# Patient Record
Sex: Female | Born: 1970 | Race: White | Hispanic: No | Smoking: Current every day smoker
Health system: Southern US, Community
[De-identification: ages and names within clinical notes are randomized; demographics above are authoritative.]

## PROBLEM LIST (undated history)

## (undated) DIAGNOSIS — R519 Headache, unspecified: Secondary | ICD-10-CM

## (undated) DIAGNOSIS — F419 Anxiety disorder, unspecified: Secondary | ICD-10-CM

## (undated) DIAGNOSIS — R112 Nausea with vomiting, unspecified: Secondary | ICD-10-CM

## (undated) DIAGNOSIS — Z9889 Other specified postprocedural states: Secondary | ICD-10-CM

## (undated) DIAGNOSIS — R51 Headache: Secondary | ICD-10-CM

## (undated) DIAGNOSIS — Z5111 Encounter for antineoplastic chemotherapy: Secondary | ICD-10-CM

## (undated) DIAGNOSIS — Z923 Personal history of irradiation: Secondary | ICD-10-CM

## (undated) DIAGNOSIS — R918 Other nonspecific abnormal finding of lung field: Secondary | ICD-10-CM

## (undated) DIAGNOSIS — N390 Urinary tract infection, site not specified: Secondary | ICD-10-CM

## (undated) HISTORY — DX: Encounter for antineoplastic chemotherapy: Z51.11

## (undated) HISTORY — DX: Anxiety disorder, unspecified: F41.9

---

## 1998-08-30 HISTORY — PX: BREAST SURGERY: SHX581

## 2017-01-31 DIAGNOSIS — N951 Menopausal and female climacteric states: Secondary | ICD-10-CM | POA: Insufficient documentation

## 2017-04-15 ENCOUNTER — Ambulatory Visit (HOSPITAL_BASED_OUTPATIENT_CLINIC_OR_DEPARTMENT_OTHER)
Admission: RE | Admit: 2017-04-15 | Discharge: 2017-04-15 | Disposition: A | Discharge status: Home / Self Care | Payer: Self-pay | Source: Ambulatory Visit | Attending: Pulmonary Disease | Admitting: Pulmonary Disease

## 2017-04-15 ENCOUNTER — Ambulatory Visit (INDEPENDENT_AMBULATORY_CARE_PROVIDER_SITE_OTHER): Payer: Self-pay | Admitting: Pulmonary Disease

## 2017-04-15 ENCOUNTER — Encounter: Payer: Self-pay | Admitting: Pulmonary Disease

## 2017-04-15 ENCOUNTER — Other Ambulatory Visit (INDEPENDENT_AMBULATORY_CARE_PROVIDER_SITE_OTHER): Payer: Self-pay

## 2017-04-15 VITALS — BP 116/72 | HR 88 | Ht 64.0 in | Wt 168.0 lb

## 2017-04-15 DIAGNOSIS — R918 Other nonspecific abnormal finding of lung field: Secondary | ICD-10-CM

## 2017-04-15 DIAGNOSIS — R053 Chronic cough: Secondary | ICD-10-CM | POA: Insufficient documentation

## 2017-04-15 DIAGNOSIS — C771 Secondary and unspecified malignant neoplasm of intrathoracic lymph nodes: Secondary | ICD-10-CM | POA: Insufficient documentation

## 2017-04-15 DIAGNOSIS — R05 Cough: Secondary | ICD-10-CM

## 2017-04-15 DIAGNOSIS — C801 Malignant (primary) neoplasm, unspecified: Secondary | ICD-10-CM | POA: Insufficient documentation

## 2017-04-15 DIAGNOSIS — R911 Solitary pulmonary nodule: Secondary | ICD-10-CM | POA: Insufficient documentation

## 2017-04-15 LAB — CBC WITH DIFFERENTIAL/PLATELET
BASOS ABS: 0.1 10*3/uL (ref 0.0–0.1)
Basophils Relative: 0.8 % (ref 0.0–3.0)
EOS ABS: 0.1 10*3/uL (ref 0.0–0.7)
Eosinophils Relative: 0.7 % (ref 0.0–5.0)
HEMATOCRIT: 43.2 % (ref 36.0–46.0)
Hemoglobin: 14.3 g/dL (ref 12.0–15.0)
LYMPHS PCT: 20.2 % (ref 12.0–46.0)
Lymphs Abs: 2.8 10*3/uL (ref 0.7–4.0)
MCHC: 33.2 g/dL (ref 30.0–36.0)
MCV: 95.5 fl (ref 78.0–100.0)
Monocytes Absolute: 0.9 10*3/uL (ref 0.1–1.0)
Monocytes Relative: 6.4 % (ref 3.0–12.0)
NEUTROS ABS: 10 10*3/uL — AB (ref 1.4–7.7)
NEUTROS PCT: 71.9 % (ref 43.0–77.0)
PLATELETS: 559 10*3/uL — AB (ref 150.0–400.0)
RBC: 4.53 Mil/uL (ref 3.87–5.11)
RDW: 14 % (ref 11.5–15.5)
WBC: 13.9 10*3/uL — ABNORMAL HIGH (ref 4.0–10.5)

## 2017-04-15 LAB — COMPREHENSIVE METABOLIC PANEL
ALT: 19 U/L (ref 0–35)
AST: 14 U/L (ref 0–37)
Albumin: 3.4 g/dL — ABNORMAL LOW (ref 3.5–5.2)
Alkaline Phosphatase: 160 U/L — ABNORMAL HIGH (ref 39–117)
BILIRUBIN TOTAL: 1.1 mg/dL (ref 0.2–1.2)
BUN: 7 mg/dL (ref 6–23)
CHLORIDE: 96 meq/L (ref 96–112)
CO2: 29 meq/L (ref 19–32)
CREATININE: 0.57 mg/dL (ref 0.40–1.20)
Calcium: 9.2 mg/dL (ref 8.4–10.5)
GFR: 120.1 mL/min (ref >60.00)
GLUCOSE: 93 mg/dL (ref 70–99)
Potassium: 3.7 mEq/L (ref 3.5–5.1)
Sodium: 131 mEq/L — ABNORMAL LOW (ref 135–145)
Total Protein: 6.3 g/dL (ref 6.0–8.3)

## 2017-04-15 LAB — TSH: TSH: 1.27 u[IU]/mL (ref 0.35–4.50)

## 2017-04-15 MED ORDER — IOPAMIDOL (ISOVUE-300) INJECTION 61%
75.0000 mL | Freq: Once | INTRAVENOUS | Status: AC | PRN
  Administered 2017-04-15: 75 mL via INTRAVENOUS

## 2017-04-15 NOTE — Patient Instructions (Signed)
Take Delsym cough syrup 5 ML twice daily for cough for 4 weeks Blood work today Schedule CT chest with IV contrast  We will call you with results

## 2017-04-15 NOTE — Assessment & Plan Note (Addendum)
  Blood work today Schedule CT chest with IV contrast  Addendum-CT chest showed 2.2 cm left upper lobe nodule with enlarged infrahilar and subcarinal lymphadenopathy very suspicious for metastatic malignancy. We'll proceed with PET scan and based on this she will need bronchoscopy with endobronchial ultrasound to sample the lymph nodes. Labs showed hyponatremia which would be consistent with lung malignancy and mild leukocytosis which may be related to steroids

## 2017-04-15 NOTE — Addendum Note (Signed)
Addended by: Kara Mead V on: 04/15/2017 05:10 PM   Modules accepted: Level of Service

## 2017-04-15 NOTE — Assessment & Plan Note (Signed)
Presumed post bronchitic, unless due to lung mass  Take Delsym cough syrup 5 ML twice daily for cough for 4 weeks

## 2017-04-15 NOTE — Progress Notes (Signed)
   Subjective:    Patient ID: Raven Cochran, female    DOB: [birth year], 46 y.o.   MRN: [id number]  HPI  46 y.o. smoker presents for evaluation of abnormal chest x-ray. [facility] complaining of postnasal drainage and cough for 6 weeks . She associates this with taking antibiotic for a kidney infection given by her OB. she took Mucinex for 2 days and apparently this caused swelling in her hands and feet. Chest x-ray was performed which showed increased density in the left mid lung field and prominent left hilar lymphadenopathy  For her, she was given antibiotics x2, prednisone taper and Tessalon Perles but this is not really helped her cough and cough is persistent. She describes a deep cough occasionally productive of yellow sputum probably sometimes wakes her up at night.  She was given Lasix by her OB but this has not affected her swelling  She denies fevers, hemoptysis, sick contacts or weight loss or loss of appetite       No past medical history on file.  No past surgical history on file. -Breast augmentation 2010  No Known Allergies   Social History   Social History  . Marital status: Divorced    Spouse name: N/A  . Number of children: N/A  . Years of education: N/A   Occupational History  . Not on file.   Social History Main Topics  . Smoking status: Current Every Day Smoker    Types: Cigarettes  . Smokeless tobacco: Never Used  . Alcohol use No  . Drug use: No  . Sexual activity: Not on file   Other Topics Concern  . Not on file   Social History Narrative  . No narrative on file     Review of Systems Positive for shortness of breath with activity, productive cough, chest pain, loss of appetite, sore throat, nasal congestion and sneezing and swelling of her hands and feet  Constitutional: negative for anorexia, fevers and sweats  Eyes: negative for irritation, redness and visual disturbance  Ears, nose, mouth, throat, and  face: negative for earaches, epistaxis, nasal congestion and sore throat  Respiratory: negative for sputum and wheezing  Cardiovascular: negative for orthopnea, palpitations and syncope  Gastrointestinal: negative for abdominal pain, constipation, diarrhea, melena, nausea and vomiting  Genitourinary:negative for dysuria, frequency and hematuria  Hematologic/lymphatic: negative for bleeding, easy bruising and lymphadenopathy  Musculoskeletal:negative for arthralgias, muscle weakness and stiff joints  Neurological: negative for coordination problems, gait problems, headaches and weakness  Endocrine: negative for diabetic symptoms including polydipsia, polyuria and weight loss     Objective:   Physical Exam  Gen. Pleasant, well-nourished, in no distress, normal affect ENT - no lesions, no post nasal drip Neck: No JVD, no thyromegaly, no carotid bruits Lungs: no use of accessory muscles, no dullness to percussion, clear without rales or rhonchi  Cardiovascular: Rhythm regular, heart sounds  normal, no murmurs or gallops, no peripheral edema Abdomen: soft and non-tender, no hepatosplenomegaly, BS normal. Musculoskeletal: No deformities, no cyanosis or clubbing, stubby fingers & toes Neuro:  alert, non focal       Assessment & Plan:

## 2017-04-18 ENCOUNTER — Telehealth: Payer: Self-pay | Admitting: Pulmonary Disease

## 2017-04-18 NOTE — Telephone Encounter (Signed)
Spoke with someone at [facility]. They wanted to make [place] aware of pt's CT chest report. It looks like RA has already resulted this. Nothing further was needed.

## 2017-04-19 ENCOUNTER — Telehealth: Payer: Self-pay | Admitting: Pulmonary Disease

## 2017-04-19 DIAGNOSIS — R911 Solitary pulmonary nodule: Secondary | ICD-10-CM

## 2017-04-19 DIAGNOSIS — R590 Localized enlarged lymph nodes: Secondary | ICD-10-CM

## 2017-04-19 NOTE — Telephone Encounter (Signed)
Notes recorded by Rigoberto Noel, MD on 04/15/2017 at 5:05 PM EDT CT chest showed left upper lobe nodule and enlarged lymph nodes in the chest She will need a biopsy of the enlarged lymph glands but prior to that I would like her to get a PET scan which is a whole-body scan Labs showed mild increase in white cell count and slightly low sodium  Spoke with patient regarding her lab results. She verbalized understanding. Nothing else needed at time of call. Will place order for PET scan today.

## 2017-04-19 NOTE — Telephone Encounter (Signed)
Spoke with pt. She is requesting her lab results from 04/15/17. Pt is aware that RA is on vacation is she is not willing to wait until his return.  TP - please advise. Thanks.

## 2017-04-26 ENCOUNTER — Encounter (HOSPITAL_COMMUNITY): Payer: Self-pay | Admitting: Radiology

## 2017-04-26 ENCOUNTER — Ambulatory Visit (HOSPITAL_COMMUNITY)
Admission: RE | Admit: 2017-04-26 | Discharge: 2017-04-26 | Disposition: A | Discharge status: Home / Self Care | Payer: Self-pay | Source: Ambulatory Visit | Attending: Pulmonary Disease | Admitting: Pulmonary Disease

## 2017-04-26 DIAGNOSIS — R911 Solitary pulmonary nodule: Secondary | ICD-10-CM | POA: Insufficient documentation

## 2017-04-26 DIAGNOSIS — C779 Secondary and unspecified malignant neoplasm of lymph node, unspecified: Secondary | ICD-10-CM | POA: Insufficient documentation

## 2017-04-26 DIAGNOSIS — R590 Localized enlarged lymph nodes: Secondary | ICD-10-CM | POA: Insufficient documentation

## 2017-04-26 LAB — GLUCOSE, CAPILLARY: GLUCOSE-CAPILLARY: 104 mg/dL — AB (ref 65–99)

## 2017-04-26 MED ORDER — FLUDEOXYGLUCOSE F - 18 (FDG) INJECTION
8.6000 | Freq: Once | INTRAVENOUS | Status: AC | PRN
  Administered 2017-04-26: 8.6 via INTRAVENOUS

## 2017-04-27 ENCOUNTER — Telehealth: Payer: Self-pay | Admitting: Pulmonary Disease

## 2017-04-27 DIAGNOSIS — R590 Localized enlarged lymph nodes: Secondary | ICD-10-CM

## 2017-04-27 NOTE — Telephone Encounter (Signed)
Hey do you have any day preferences and [place] or [place]

## 2017-04-27 NOTE — Telephone Encounter (Signed)
Discussed PET scan results. We'll proceed with EBUS and bronchoscopy -discussed procedure   And risks and benefits

## 2017-04-29 ENCOUNTER — Encounter (HOSPITAL_COMMUNITY): Payer: Self-pay | Admitting: *Deleted

## 2017-04-29 ENCOUNTER — Other Ambulatory Visit: Payer: Self-pay | Admitting: Pulmonary Disease

## 2017-05-03 ENCOUNTER — Ambulatory Visit (HOSPITAL_COMMUNITY): Payer: Self-pay

## 2017-05-03 ENCOUNTER — Ambulatory Visit (HOSPITAL_COMMUNITY)
Admission: RE | Admit: 2017-05-03 | Discharge: 2017-05-03 | Disposition: A | Discharge status: Home / Self Care | Payer: Self-pay | Source: Ambulatory Visit | Attending: Pulmonary Disease | Admitting: Pulmonary Disease

## 2017-05-03 ENCOUNTER — Ambulatory Visit (HOSPITAL_COMMUNITY): Payer: Self-pay | Admitting: Anesthesiology

## 2017-05-03 ENCOUNTER — Encounter (HOSPITAL_COMMUNITY)
Admission: RE | Disposition: A | Discharge status: Home / Self Care | Payer: Self-pay | Source: Ambulatory Visit | Attending: Pulmonary Disease

## 2017-05-03 ENCOUNTER — Encounter (HOSPITAL_COMMUNITY): Payer: Self-pay | Admitting: *Deleted

## 2017-05-03 ENCOUNTER — Other Ambulatory Visit (HOSPITAL_COMMUNITY)
Admission: RE | Admit: 2017-05-03 | Discharge: 2017-05-03 | Disposition: A | Discharge status: Home / Self Care | Payer: Self-pay | Source: Ambulatory Visit | Attending: Internal Medicine | Admitting: Internal Medicine

## 2017-05-03 DIAGNOSIS — R918 Other nonspecific abnormal finding of lung field: Secondary | ICD-10-CM | POA: Insufficient documentation

## 2017-05-03 DIAGNOSIS — Z9889 Other specified postprocedural states: Secondary | ICD-10-CM

## 2017-05-03 DIAGNOSIS — C3490 Malignant neoplasm of unspecified part of unspecified bronchus or lung: Secondary | ICD-10-CM | POA: Insufficient documentation

## 2017-05-03 DIAGNOSIS — C3432 Malignant neoplasm of lower lobe, left bronchus or lung: Secondary | ICD-10-CM | POA: Insufficient documentation

## 2017-05-03 DIAGNOSIS — R590 Localized enlarged lymph nodes: Secondary | ICD-10-CM

## 2017-05-03 DIAGNOSIS — F17210 Nicotine dependence, cigarettes, uncomplicated: Secondary | ICD-10-CM | POA: Insufficient documentation

## 2017-05-03 HISTORY — DX: Other specified postprocedural states: Z98.890

## 2017-05-03 HISTORY — DX: Nausea with vomiting, unspecified: R11.2

## 2017-05-03 HISTORY — DX: Headache: R51

## 2017-05-03 HISTORY — DX: Urinary tract infection, site not specified: N39.0

## 2017-05-03 HISTORY — PX: ENDOBRONCHIAL ULTRASOUND: SHX5096

## 2017-05-03 HISTORY — DX: Headache, unspecified: R51.9

## 2017-05-03 HISTORY — DX: Other nonspecific abnormal finding of lung field: R91.8

## 2017-05-03 SURGERY — ENDOBRONCHIAL ULTRASOUND (EBUS)
Anesthesia: General | Laterality: Bilateral

## 2017-05-03 MED ORDER — ONDANSETRON HCL 4 MG/2ML IJ SOLN
INTRAMUSCULAR | Status: DC | PRN
  Administered 2017-05-03: 4 mg via INTRAVENOUS

## 2017-05-03 MED ORDER — PROPOFOL 10 MG/ML IV BOLUS
INTRAVENOUS | Status: AC
  Filled 2017-05-03: qty 20

## 2017-05-03 MED ORDER — LACTATED RINGERS IV SOLN: INTRAVENOUS | Status: DC

## 2017-05-03 MED ORDER — SUGAMMADEX SODIUM 200 MG/2ML IV SOLN
INTRAVENOUS | Status: DC | PRN
  Administered 2017-05-03: 150 mg via INTRAVENOUS

## 2017-05-03 MED ORDER — SUCCINYLCHOLINE CHLORIDE 20 MG/ML IJ SOLN
INTRAMUSCULAR | Status: DC | PRN
  Administered 2017-05-03: 100 mg via INTRAVENOUS

## 2017-05-03 MED ORDER — PHENYLEPHRINE 40 MCG/ML (10ML) SYRINGE FOR IV PUSH (FOR BLOOD PRESSURE SUPPORT)
PREFILLED_SYRINGE | INTRAVENOUS | Status: AC
  Filled 2017-05-03: qty 20

## 2017-05-03 MED ORDER — FENTANYL CITRATE (PF) 100 MCG/2ML IJ SOLN
INTRAMUSCULAR | Status: AC
  Filled 2017-05-03: qty 2

## 2017-05-03 MED ORDER — MIDAZOLAM HCL 2 MG/2ML IJ SOLN
INTRAMUSCULAR | Status: AC
  Filled 2017-05-03: qty 2

## 2017-05-03 MED ORDER — ROCURONIUM BROMIDE 50 MG/5ML IV SOSY
PREFILLED_SYRINGE | INTRAVENOUS | Status: AC
  Filled 2017-05-03: qty 5

## 2017-05-03 MED ORDER — FENTANYL CITRATE (PF) 100 MCG/2ML IJ SOLN
INTRAMUSCULAR | Status: DC | PRN
  Administered 2017-05-03: 100 ug via INTRAVENOUS

## 2017-05-03 MED ORDER — LACTATED RINGERS IV SOLN
INTRAVENOUS | Status: DC
  Administered 2017-05-03: 1000 mL via INTRAVENOUS

## 2017-05-03 MED ORDER — ONDANSETRON HCL 4 MG/2ML IJ SOLN
INTRAMUSCULAR | Status: AC
  Filled 2017-05-03: qty 2

## 2017-05-03 MED ORDER — LIDOCAINE 2% (20 MG/ML) 5 ML SYRINGE
INTRAMUSCULAR | Status: AC
  Filled 2017-05-03: qty 10

## 2017-05-03 MED ORDER — SUGAMMADEX SODIUM 200 MG/2ML IV SOLN
INTRAVENOUS | Status: AC
  Filled 2017-05-03: qty 2

## 2017-05-03 MED ORDER — LIDOCAINE HCL (CARDIAC) 20 MG/ML IV SOLN
INTRAVENOUS | Status: DC | PRN
  Administered 2017-05-03 (×2): 50 mg via INTRAVENOUS

## 2017-05-03 MED ORDER — DEXAMETHASONE SODIUM PHOSPHATE 10 MG/ML IJ SOLN
INTRAMUSCULAR | Status: AC
  Filled 2017-05-03: qty 1

## 2017-05-03 MED ORDER — MIDAZOLAM HCL 5 MG/5ML IJ SOLN
INTRAMUSCULAR | Status: DC | PRN
  Administered 2017-05-03: 1 mg via INTRAVENOUS
  Administered 2017-05-03: 2 mg via INTRAVENOUS

## 2017-05-03 MED ORDER — DEXAMETHASONE SODIUM PHOSPHATE 10 MG/ML IJ SOLN
INTRAMUSCULAR | Status: DC | PRN
  Administered 2017-05-03: 10 mg via INTRAVENOUS

## 2017-05-03 MED ORDER — PROPOFOL 10 MG/ML IV BOLUS
INTRAVENOUS | Status: DC | PRN
Start: 2017-05-03 — End: 2017-05-03
  Administered 2017-05-03: 150 mg via INTRAVENOUS
  Administered 2017-05-03: 50 mg via INTRAVENOUS

## 2017-05-03 MED ORDER — SUCCINYLCHOLINE CHLORIDE 200 MG/10ML IV SOSY
PREFILLED_SYRINGE | INTRAVENOUS | Status: AC
  Filled 2017-05-03: qty 10

## 2017-05-03 MED ORDER — PROMETHAZINE HCL 25 MG/ML IJ SOLN: 6.2500 mg | INTRAMUSCULAR | Status: DC | PRN

## 2017-05-03 MED ORDER — FENTANYL CITRATE (PF) 100 MCG/2ML IJ SOLN: 25.0000 ug | INTRAMUSCULAR | Status: DC | PRN

## 2017-05-03 MED ORDER — ROCURONIUM BROMIDE 100 MG/10ML IV SOLN
INTRAVENOUS | Status: DC | PRN
  Administered 2017-05-03: 20 mg via INTRAVENOUS

## 2017-05-03 NOTE — Interval H&P Note (Signed)
History and Physical Interval Note:  05/03/2017 11:55 AM  Hallie Adah Salvage  has presented today for surgery, with the diagnosis of MEDIAL STINAL LYMPHADENOTATHY  The various methods of treatment have been discussed with the patient and family. After consideration of risks, benefits and other options for treatment, the patient has consented to  Procedure(s): ENDOBRONCHIAL ULTRASOUND (Bilateral) as a surgical intervention .  The patient's history has been reviewed, patient examined, no change in status, stable for surgery.  I have reviewed the patient's chart and labs.  Questions were answered to the patient's satisfaction.     ALVA,RAKESH V.

## 2017-05-03 NOTE — Progress Notes (Signed)
Video bronchoscopy performed following EBUS procedure. Intervention bronchial washings Intervention bronchial brushings Intervention bronchial biopsies Pt tolerated well  Kathie Dike RRT

## 2017-05-03 NOTE — Anesthesia Procedure Notes (Signed)
Procedure Name: Intubation Date/Time: 05/03/2017 12:17 PM Performed by: Glory Buff Pre-anesthesia Checklist: Patient identified, Emergency Drugs available, Suction available and Patient being monitored Patient Re-evaluated:Patient Re-evaluated prior to induction Oxygen Delivery Method: Circle system utilized Preoxygenation: Pre-oxygenation with 100% oxygen Induction Type: IV induction Ventilation: Mask ventilation without difficulty Laryngoscope Size: Miller and 3 Grade View: Grade III Tube type: Oral Tube size: 8.5 mm Number of attempts: 1 Airway Equipment and Method: Stylet and Oral airway Placement Confirmation: ETT inserted through vocal cords under direct vision,  positive ETCO2 and breath sounds checked- equal and bilateral Secured at: 21 cm Tube secured with: Tape Dental Injury: Teeth and Oropharynx as per pre-operative assessment

## 2017-05-03 NOTE — Anesthesia Preprocedure Evaluation (Addendum)
Anesthesia Evaluation  Patient identified by MRN, date of birth, ID band Patient awake    Reviewed: Allergy & Precautions, NPO status , Patient's Chart, lab work & pertinent test results  Airway Mallampati: III  TM Distance: <3 FB Neck ROM: Full    Dental no notable dental hx. (+) Loose, Dental Advisory Given Lower back:   Pulmonary Current Smoker,    Pulmonary exam normal breath sounds clear to auscultation       Cardiovascular negative cardio ROS Normal cardiovascular exam Rhythm:Regular Rate:Normal     Neuro/Psych negative neurological ROS  negative psych ROS   GI/Hepatic negative GI ROS, Neg liver ROS,   Endo/Other  negative endocrine ROS  Renal/GU negative Renal ROS  negative genitourinary   Musculoskeletal negative musculoskeletal ROS (+)   Abdominal   Peds negative pediatric ROS (+)  Hematology negative hematology ROS (+)   Anesthesia Other Findings   Reproductive/Obstetrics negative OB ROS                           Anesthesia Physical Anesthesia Plan  ASA: II  Anesthesia Plan: General   Post-op Pain Management:    Induction: Intravenous  PONV Risk Score and Plan: 1 and Ondansetron, Dexamethasone and Treatment may vary due to age or medical condition  Airway Management Planned: Oral ETT  Additional Equipment:   Intra-op Plan:   Post-operative Plan: Extubation in OR  Informed Consent: I have reviewed the patients History and Physical, chart, labs and discussed the procedure including the risks, benefits and alternatives for the proposed anesthesia with the patient or authorized representative who has indicated his/her understanding and acceptance.   Dental advisory given  Plan Discussed with: CRNA and Surgeon  Anesthesia Plan Comments:         Anesthesia Quick Evaluation

## 2017-05-03 NOTE — H&P (View-Only) (Signed)
   Subjective:    Patient ID: Raven Cochran, female    DOB: 1971/04/28, 46 y.o.   MRN: [id number]  HPI  46 year old smoker presents for evaluation of abnormal chest x-ray. [facility] complaining of postnasal drainage and cough for 6 weeks . She associates this with taking antibiotic for a kidney infection given by her OB. she took Mucinex for 2 days and apparently this caused swelling in her hands and feet. Chest x-ray was performed which showed increased density in the left mid lung field and prominent left hilar lymphadenopathy  For her, she was given antibiotics x2, prednisone taper and Tessalon Perles but this is not really helped her cough and cough is persistent. She describes a deep cough occasionally productive of yellow sputum probably sometimes wakes her up at night.  She was given Lasix by her OB but this has not affected her swelling  She denies fevers, hemoptysis, sick contacts or weight loss or loss of appetite       No past medical history on file.  No past surgical history on file. -Breast augmentation 2010  No Known Allergies   Social History   Social History  . Marital status: Divorced    Spouse name: N/A  . Number of children: N/A  . Years of education: N/A   Occupational History  . Not on file.   Social History Main Topics  . Smoking status: Current Every Day Smoker    Types: Cigarettes  . Smokeless tobacco: Never Used  . Alcohol use No  . Drug use: No  . Sexual activity: Not on file   Other Topics Concern  . Not on file   Social History Narrative  . No narrative on file     Review of Systems Positive for shortness of breath with activity, productive cough, chest pain, loss of appetite, sore throat, nasal congestion and sneezing and swelling of her hands and feet  Constitutional: negative for anorexia, fevers and sweats  Eyes: negative for irritation, redness and visual disturbance  Ears, nose, mouth, throat, and  face: negative for earaches, epistaxis, nasal congestion and sore throat  Respiratory: negative for sputum and wheezing  Cardiovascular: negative for orthopnea, palpitations and syncope  Gastrointestinal: negative for abdominal pain, constipation, diarrhea, melena, nausea and vomiting  Genitourinary:negative for dysuria, frequency and hematuria  Hematologic/lymphatic: negative for bleeding, easy bruising and lymphadenopathy  Musculoskeletal:negative for arthralgias, muscle weakness and stiff joints  Neurological: negative for coordination problems, gait problems, headaches and weakness  Endocrine: negative for diabetic symptoms including polydipsia, polyuria and weight loss     Objective:   Physical Exam  Gen. Pleasant, well-nourished, in no distress, normal affect ENT - no lesions, no post nasal drip Neck: No JVD, no thyromegaly, no carotid bruits Lungs: no use of accessory muscles, no dullness to percussion, clear without rales or rhonchi  Cardiovascular: Rhythm regular, heart sounds  normal, no murmurs or gallops, no peripheral edema Abdomen: soft and non-tender, no hepatosplenomegaly, BS normal. Musculoskeletal: No deformities, no cyanosis or clubbing, stubby fingers & toes Neuro:  alert, non focal       Assessment & Plan:

## 2017-05-03 NOTE — Transfer of Care (Signed)
Immediate Anesthesia Transfer of Care Note  Patient: Raven Cochran  Procedure(s) Performed: Procedure(s): ENDOBRONCHIAL ULTRASOUND (Bilateral)  Patient Location: PACU and Endoscopy Unit  Anesthesia Type:General  Level of Consciousness: awake and alert   Airway & Oxygen Therapy: Patient Spontanous Breathing and Patient connected to face mask oxygen  Post-op Assessment: Report given to RN and Post -op Vital signs reviewed and stable  Post vital signs: Reviewed and stable  Last Vitals:  Vitals:   05/03/17 1140  BP: 133/76  Pulse: 85  Resp: 14  Temp: 36.6 C  SpO2: 98%    Last Pain:  Vitals:   05/03/17 1140  TempSrc: Oral         Complications: No apparent anesthesia complications

## 2017-05-03 NOTE — Discharge Instructions (Signed)
Flexible Bronchoscopy, Care After These instructions give you information on caring for yourself after your procedure. Your doctor may also give you more specific instructions. Call your doctor if you have any problems or questions after your procedure. Follow these instructions at home:  Do not eat or drink anything for 2 hours after your procedure. If you try to eat or drink before the medicine wears off, food or drink could go into your lungs. You could also burn yourself.  After 2 hours have passed and when you can cough and gag normally, you may eat soft food and drink liquids slowly.  The day after the test, you may eat your normal diet.  You may do your normal activities.  Keep all doctor visits. Get help right away if:  You get more and more short of breath.  You get light-headed.  You feel like you are going to pass out (faint).  You have chest pain.  You have new problems that worry you.  You cough up more than a little blood.  You cough up more blood than before. This information is not intended to replace advice given to you by your health care provider. Make sure you discuss any questions you have with your health care provider. Document Released: 06/13/2009 Document Revised: 01/22/2016 Document Reviewed: 04/20/2013 Elsevier Interactive Patient Education  2017 [employer].

## 2017-05-03 NOTE — Op Note (Signed)
  Name:  [name] MRN:  [id number] DOB:  [birth year]  PROCEDURE NOTE  Procedure(s): Flexible bronchoscopy [phone]) Brushing [phone]) of the LUL & LLL bifurcation Bronchial alveolar lavage (93818) of the LUL Endobronchial biopsy (29937) of the LUL Endobronchial ultrasound (16967) Transbronchial needle aspiration (89381) of station 7 Transbronchial needle aspiration, additional lobe (01751) of station 10L  Indications:  LUL mass with Hilar / mediastinal lymphadenopathy.  Consent:  Written informed consent was obtained prior to the procedure. The risks of the procedure including coughing, bleeding and the small chance of lung puncture requiring chest tube were discussed in great detail. The benefits & alternatives including serial follow up were also discussed.  Anesthesia:  General endotracheal.  Procedure summary:  Appropriate equipment was assembled.  The patient was  identified as Raven Cochran. Interim history obtained and brought to the operating room. Safety timeout was performed. The patient was placed supine on the operating table, airway established and general anesthesia administered by Anesthesia team.   After the appropriate level of anesthesia was assured, flexible video bronchoscope was lubricated and inserted through the endotracheal tube.    Airway examination was performed bilaterally to subsegmental level.  Minimal clear secretions were noted, mucosa appeared cobble stoning in LUL bifurcation and narrowing of LLL bifurcation , could not be visualised distally with friable mucosa bleeding to touch, although no endobronchial lesions were identified.  Endobronchial ultrasound video bronchoscope was then lubricated and inserted through the endotracheal tube. Surveillance of the mediastinal and and bilateral hilar lymph node stations was performed.  Pathologically enlarged lymph nodes were noted at station 7 & 10L.  Endobronchial ultrasound guided transbronchial  needle aspiration of station 7  (passes 5), station 10L  (passes 3) were performed, after which EBUS bronchoscope was withdrawn.  Flexible video bronchoscope was used again to perform random endobronchial mucosal biopsies, brushings & BAL from LUL.  After ensuring hemostasis , the bronchoscope was withdrawn.  The patient was extubated in operating room and transferred to PACU. Post-procedure chest x-ray was ordered.  Specimens sent: Bronchial alveolar lavage specimen of the LUL for  microbiology and cytology. Brushing Endobronchial biopsy TBNA for cytology from station 7 & 10 L  Complications:  No immediate complications were noted.  Hemodynamic parameters and oxygenation remained stable throughout the procedure.  Estimated blood loss:  Less then 5 mL.   Kara Mead MD. Shade Flood. [facility] Pager [phone] If no response call [phone]   05/03/2017 1:11 PM

## 2017-05-03 NOTE — Anesthesia Postprocedure Evaluation (Signed)
Anesthesia Post Note  Patient: Raven Cochran  Procedure(s) Performed: Procedure(s) (LRB): ENDOBRONCHIAL ULTRASOUND (Bilateral)     Patient location during evaluation: PACU Anesthesia Type: General Level of consciousness: awake and alert Pain management: pain level controlled Vital Signs Assessment: post-procedure vital signs reviewed and stable Respiratory status: spontaneous breathing, nonlabored ventilation, respiratory function stable and patient connected to nasal cannula oxygen Cardiovascular status: blood pressure returned to baseline and stable Postop Assessment: no signs of nausea or vomiting Anesthetic complications: no    Last Vitals:  Vitals:   05/03/17 1140 05/03/17 1328  BP: 133/76 138/76  Pulse: 85 99  Resp: 14 (!) 22  Temp: 36.6 C 36.8 C  SpO2: 98% 97%    Last Pain:  Vitals:   05/03/17 1328  TempSrc: Oral                 Delphine Sizemore S

## 2017-05-03 NOTE — Anesthesia Procedure Notes (Signed)
Date/Time: 05/03/2017 1:17 PM Performed by: Cynda Familia Oxygen Delivery Method: Simple face mask Placement Confirmation: breath sounds checked- equal and bilateral and positive ETCO2 Dental Injury: Teeth and Oropharynx as per pre-operative assessment  Comments: Extubated to face mask--- good aw --- coughing--- to pacu endo o2 intact

## 2017-05-05 ENCOUNTER — Ambulatory Visit (INDEPENDENT_AMBULATORY_CARE_PROVIDER_SITE_OTHER): Payer: Self-pay | Admitting: Adult Health

## 2017-05-05 ENCOUNTER — Encounter: Payer: Self-pay | Admitting: Adult Health

## 2017-05-05 VITALS — HR 89 | Ht 64.0 in | Wt 168.0 lb

## 2017-05-05 DIAGNOSIS — R590 Localized enlarged lymph nodes: Secondary | ICD-10-CM

## 2017-05-05 DIAGNOSIS — R918 Other nonspecific abnormal finding of lung field: Secondary | ICD-10-CM

## 2017-05-05 NOTE — Assessment & Plan Note (Signed)
Lung cancer with positive malignant cells  Final path pending  Refer to Oncology .

## 2017-05-05 NOTE — Patient Instructions (Signed)
We are referring you to Oncology for lung cancer .  Their office will be in touch with the date and time .

## 2017-05-05 NOTE — Assessment & Plan Note (Signed)
LUL lung mass with adenopathy , positive on PET scan . FOB/EBUS with Positive malignant cells for lung cancer . Final path is pending with markers.  Reviewed with pt and husband . Will refer ASAP to Oncology .

## 2017-05-05 NOTE — Progress Notes (Signed)
@Patient  ID: Raven Cochran, female    DOB: 04-19-1971, 46 y.o.   MRN: [id number]  Chief Complaint  Patient presents with  . Follow-up    Referring provider: Sheldon Silvan., MD  HPI: 46 yo female smoker seen for pulmonary consult on 04/15/17 for lung mass.   TEST  CT chest 04/15/17 >LEFT upper lobe nodule concerning for bronchogenic carcinoma.Metastatic adenopathy to the LEFT hilum, subcarinal nodal station and prevascular nodal stations. 04/26/17 PET scan Hypermetabolic LEFT upper lobe pulmonary nodule consistent primary bronchogenic carcinoma. 2. Bulky hypermetabolic LEFT hilar prevascular and subcarinal / peribronchial metastatic adenopathy. 3. No contralateral or  supraclavicular metastatic lymph nodes.  05/05/2017 Follow up : Lung Mass -Lung Cancer  Pt returns for 2 week follow up . She was seen last ov for pulmonary consult for lung mass. She had had a 6 week hx of cough and was seen at urgent care , treated for bronchitis . Chest xray showed left mid lung density and left hilar adenopathy . Subsequent CT chest showed 2.2cm LUL nodule with enlarged infrahilar and subcarinal lymphadenopathy . PET scan showed Hypermetabolic left upper lobe nodule measuring 2.1 x 1.9 cm. Hypermetabolic Left hilar mass measuring 4.2 cm, hypermetabolic subcarinal node. She underwent FOB/EBUS on 05/03/2017 showed a narrowing of the left lower lobe bifurcation, although no endobronchial lesions were identified. Pathologically enlarged lymph nodes were noted at station 7 and 10. Final cytology and pathology are pending with markers. Preliminary findings are positive for malignancy.  Case was discussed with Dr. Elsworth Soho. I went over her scans and test results with patient and her husband. We discussed the need to refer to oncology. Support was provided. Since bronchoscopy. Patient says she continues to have a cough is been chronic for the last several weeks. She denies any hemoptysis, chest pain,  orthopnea, PND, or increased leg swelling.    No Known Allergies   There is no immunization history on file for this patient.  Past Medical History:  Diagnosis Date  . Headache    hx migraines none in 10 yrs  . Lung mass    left hilary mass  . PONV (postoperative nausea and vomiting)   . UTI (urinary tract infection) 9 weeks ago    Tobacco History: History  Smoking Status  . Current Every Day Smoker  . Packs/day: 1.00  . Years: 22.00  . Types: Cigarettes  Smokeless Tobacco  . Never Used   Ready to quit: Not Answered Counseling given: Not Answered   Outpatient Encounter Prescriptions as of 05/05/2017  Medication Sig  . levonorgestrel (MIRENA) 20 MCG/24HR IUD 1 each by Intrauterine route once. Inserted 8 yrs ago  . naproxen sodium (ANAPROX) 220 MG tablet Take 440 mg by mouth daily as needed (pain).   No facility-administered encounter medications on file as of 05/05/2017.      Review of Systems  Constitutional:   No  weight loss, night sweats,  Fevers, chills, +atigue, or  lassitude.  HEENT:   No headaches,  Difficulty swallowing,  Tooth/dental problems, or  Sore throat,                No sneezing, itching, ear ache, nasal congestion, post nasal drip,   CV:  No chest pain,  Orthopnea, PND, swelling in lower extremities, anasarca, dizziness, palpitations, syncope.   GI  No heartburn, indigestion, abdominal pain, nausea, vomiting, diarrhea, change in bowel habits, loss of appetite, bloody stools.   Resp: + cough -dry .  No chest wall deformity  Skin: no rash or lesions.  GU: no dysuria, change in color of urine, no urgency or frequency.  No flank pain, no hematuria   MS:  No joint pain or swelling.  No decreased range of motion.  No back pain.    Physical Exam  Pulse 89   Ht 5\' 4"  (1.626 m)   Wt 168 lb (76.2 kg)   LMP  (LMP Unknown) Comment: iud  SpO2 99%   BMI 28.84 kg/m   GEN: A/Ox3; pleasant , NAD, well nourished    HEENT:  [place]/AT,  EACs-clear,  TMs-wnl, NOSE-clear, THROAT-clear, no lesions, no postnasal drip or exudate noted.   NECK:  Supple w/ fair ROM; no JVD; normal carotid impulses w/o bruits; no thyromegaly or nodules palpated; no lymphadenopathy.    RESP  Clear  P & A; w/o, wheezes/ rales/ or rhonchi. no accessory muscle use, no dullness to percussion  CARD:  RRR, no m/r/g, no peripheral edema, pulses intact, no cyanosis or clubbing.  GI:   Soft & nt; nml bowel sounds; no organomegaly or masses detected.   Musco: Warm bil, no deformities or joint swelling noted.   Neuro: alert, no focal deficits noted.    Skin: Warm, no lesions or rashes    Lab Results:  CBC  BNP No results found for: BNP  ProBNP No results found for: PROBNP  Imaging: Ct Chest W Contrast  Result Date: 04/15/2017 CLINICAL DATA:  Persistent cough EXAM: CT CHEST WITH CONTRAST TECHNIQUE: Multidetector CT imaging of the chest was performed during intravenous contrast administration. CONTRAST:  44mL ISOVUE-300 IOPAMIDOL (ISOVUE-300) INJECTION 61% COMPARISON:  None. FINDINGS: Cardiovascular: LEFT main pulmonary artery is compressed by LEFT hilar mass. No evidence of pulmonary embolism. Coronary calcifications noted. No pericardial Mediastinum/Nodes: No axillary supraclavicular adenopathy. Bulky LEFT hilar and Prevascular lymphadenopathy. Subcarinal adenopathy additionally. Example LEFT infrahilar nodal mass measures 4 cm. Prevascular node measures 2.3 cm. Subcarinal node measures 2 cm. Lungs/Pleura: Within the LEFT upper lobe oblong nodule measures 2.2 x 1.7 cm (image 67, series 3). Upper Abdomen: Adrenal glands are partially imaged. No hepatic lesion on partially imaged liver. Musculoskeletal: No aggressive osseous lesion. Bilateral subglandular breast implants. IMPRESSION: 1. 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 2. Metastatic adenopathy to the LEFT hilum, subcarinal nodal station and prevascular nodal stations. 3. No clear  contralateral nodal metastasis or supraclavicular nodal metastasis. 4. If multidisciplinary follow up management is desired, this is available in the [place] through the Multidisciplinary Thoracic Clinic [phone]. Consider FDG PET scan for staging and biopsy planning. These results will be called to the ordering clinician or representative by the Radiologist Assistant, and communication documented in the PACS or zVision Dashboard. Electronically Signed   By: Suzy Bouchard M.D.   On: 04/15/2017 16:56   Nm Pet Image Initial (pi) Skull Base To Thigh  Result Date: 04/26/2017 CLINICAL DATA:  Initial treatment strategy for enlarged lymph nodes. LEFT pulmonary nodule. EXAM: NUCLEAR MEDICINE PET SKULL BASE TO THIGH TECHNIQUE: 8.6 mCi F-18 FDG was injected intravenously. Full-ring PET imaging was performed from the skull base to thigh after the radiotracer. CT data was obtained and used for attenuation correction and anatomic localization. FASTING BLOOD GLUCOSE:  Value: 104 mg/dl COMPARISON:  CT 8178 FINDINGS: NECK No hypermetabolic lymph nodes in the neck. CHEST LEFT upper lobe lobular nodule measures 2.1 x 1.9 cm with intense metabolic activity (SUV max equal 8.9). No additional hypermetabolic nodules in the LEFT or RIGHT lung. LEFT hilar mass is intensely  hypermetabolic measuring 4.2 cm with SUV max equal 15.5. Hypermetabolic prevascular lymph node measures 2.6 cm with SUV max equal 8.4. Equally intense hypermetabolic subcarinal / peribronchial node measuring 2.3 cm (image 71, series 4) No contralateral hypermetabolic pulmonary nodules. No hypermetabolic supraclavicular lymph nodes. Bilateral subglandular breast implants ABDOMEN/PELVIS No abnormal hypermetabolic activity within the liver, pancreas, adrenal glands, or spleen. No hypermetabolic lymph nodes in the abdomen or pelvis. IUD within the uterus. Atherosclerotic calcification of the aorta. 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 SKELETON  No focal hypermetabolic activity to suggest skeletal metastasis. IMPRESSION: 1. Hypermetabolic LEFT upper lobe pulmonary nodule consistent primary bronchogenic carcinoma. 2. Bulky hypermetabolic LEFT hilar prevascular and subcarinal / peribronchial metastatic adenopathy. 3. No contralateral or  supraclavicular metastatic lymph nodes. Electronically Signed   By: Suzy Bouchard M.D.   On: 04/26/2017 16:53   Dg Chest Port 1 View  Result Date: 05/03/2017 CLINICAL DATA:  Status post bronchoscopy with left lung biopsy EXAM: PORTABLE CHEST 1 VIEW COMPARISON:  PET-CT April 26, 2017 ; chest CT April 15, 2017 FINDINGS: There is hazy opacity in the left mid lung region, possibly hemorrhage from lung biopsy. The mass noted previous in the left lower lobe is less well seen due to the patchy opacity in this area. There is extensive left hilar/perihilar adenopathy, unchanged from recent study. Right lung is clear. No evident pneumothorax. Heart size and pulmonary vascularity are normal. No evident bone lesions. IMPRESSION: 1. Ill-defined opacity in the region of the left lower lobe mass, likely hemorrhage from recent biopsy in this area. Mass less well seen than on recent CT examination. 2. Persistent left hilar/ perihilar adenopathy, grossly unchanged compared to recent CT. 3.  No pneumothorax. 4.  Right lung clear. 5.  Stable cardiac silhouette. Electronically Signed   By: Lowella Grip III M.D.   On: 05/03/2017 13:56     Assessment & Plan:   Lung mass LUL lung mass with adenopathy , positive on PET scan . FOB/EBUS with Positive malignant cells for lung cancer . Final path is pending with markers.  Reviewed with pt and husband . Will refer ASAP to Oncology .    Mediastinal lymphadenopathy Lung cancer with positive malignant cells  Final path pending  Refer to Oncology .      Rexene Edison, NP 05/05/2017

## 2017-05-06 LAB — CULTURE, BAL-QUANTITATIVE W GRAM STAIN: Culture: NO GROWTH

## 2017-05-09 ENCOUNTER — Telehealth: Payer: Self-pay | Admitting: *Deleted

## 2017-05-09 ENCOUNTER — Encounter: Payer: Self-pay | Admitting: *Deleted

## 2017-05-09 DIAGNOSIS — R918 Other nonspecific abnormal finding of lung field: Secondary | ICD-10-CM

## 2017-05-09 NOTE — Telephone Encounter (Signed)
Oncology Nurse Navigator Documentation  Oncology Nurse Navigator Flowsheets 05/09/2017  Navigator Location [facility]  Referral date to RadOnc/MedOnc 05/09/2017  Navigator Encounter Type Telephone/I received referral today on Raven Cochran. I called her to be seen this week. I was unable to reach her. I did leave vm message for her to call me with my name and phone number.   Telephone Outgoing Call  Treatment Phase Pre-Tx/Tx Discussion  Barriers/Navigation Needs Coordination of Care  Interventions Coordination of Care  Acuity Level 1  Time Spent with Patient 15

## 2017-05-09 NOTE — Telephone Encounter (Signed)
Bronch was completed on 05/03/2017. Will sign off.

## 2017-05-09 NOTE — Progress Notes (Signed)
Oncology Nurse Navigator Documentation  Oncology Nurse Navigator Flowsheets 05/09/2017  Navigator Location [facility]  Navigator Encounter Type Telephone/I received call from patient. I updated her on appt for clinic on 05/12/17.  She verbalized understanding of appt time and place.   Telephone Incoming Call  Treatment Phase Pre-Tx/Tx Discussion  Barriers/Navigation Needs Coordination of Care;Education  Education Other  Interventions Coordination of Care;Education  Coordination of Care Appts  Education Method Verbal  Acuity Level 2  Time Spent with Patient 30

## 2017-05-09 NOTE — Progress Notes (Signed)
Reviewed & agree with plan  

## 2017-05-09 NOTE — Telephone Encounter (Signed)
Oncology Nurse Navigator Documentation  Oncology Nurse Navigator Flowsheets 05/09/2017  Navigator Location [facility]  Navigator Encounter Type Telephone/I called to schedule Raven Cochran. I was unable to reach and left vm message to call with my name and phone number.   Telephone Outgoing Call  Treatment Phase Pre-Tx/Tx Discussion  Barriers/Navigation Needs Coordination of Care  Interventions Coordination of Care  Acuity Level 1  Time Spent with Patient 15

## 2017-05-12 ENCOUNTER — Encounter: Payer: Self-pay | Admitting: Internal Medicine

## 2017-05-12 ENCOUNTER — Ambulatory Visit: Payer: Self-pay | Admitting: Physical Therapy

## 2017-05-12 ENCOUNTER — Ambulatory Visit
Admission: RE | Admit: 2017-05-12 | Discharge: 2017-05-12 | Disposition: A | Discharge status: Home / Self Care | Payer: Medicaid Other | Source: Ambulatory Visit | Attending: Radiation Oncology | Admitting: Radiation Oncology

## 2017-05-12 ENCOUNTER — Encounter: Payer: Self-pay | Admitting: *Deleted

## 2017-05-12 ENCOUNTER — Telehealth: Payer: Self-pay | Admitting: Internal Medicine

## 2017-05-12 ENCOUNTER — Other Ambulatory Visit (HOSPITAL_BASED_OUTPATIENT_CLINIC_OR_DEPARTMENT_OTHER): Payer: Self-pay

## 2017-05-12 ENCOUNTER — Ambulatory Visit (HOSPITAL_BASED_OUTPATIENT_CLINIC_OR_DEPARTMENT_OTHER): Payer: Self-pay | Admitting: Internal Medicine

## 2017-05-12 DIAGNOSIS — C3492 Malignant neoplasm of unspecified part of left bronchus or lung: Secondary | ICD-10-CM

## 2017-05-12 DIAGNOSIS — C3412 Malignant neoplasm of upper lobe, left bronchus or lung: Secondary | ICD-10-CM

## 2017-05-12 DIAGNOSIS — C779 Secondary and unspecified malignant neoplasm of lymph node, unspecified: Secondary | ICD-10-CM | POA: Insufficient documentation

## 2017-05-12 DIAGNOSIS — R918 Other nonspecific abnormal finding of lung field: Secondary | ICD-10-CM

## 2017-05-12 DIAGNOSIS — Z510 Encounter for antineoplastic radiation therapy: Secondary | ICD-10-CM | POA: Insufficient documentation

## 2017-05-12 DIAGNOSIS — Z5111 Encounter for antineoplastic chemotherapy: Secondary | ICD-10-CM | POA: Insufficient documentation

## 2017-05-12 DIAGNOSIS — Z9889 Other specified postprocedural states: Secondary | ICD-10-CM | POA: Insufficient documentation

## 2017-05-12 DIAGNOSIS — Y842 Radiological procedure and radiotherapy as the cause of abnormal reaction of the patient, or of later complication, without mention of misadventure at the time of the procedure: Secondary | ICD-10-CM | POA: Insufficient documentation

## 2017-05-12 DIAGNOSIS — Z7189 Other specified counseling: Secondary | ICD-10-CM | POA: Insufficient documentation

## 2017-05-12 DIAGNOSIS — F17210 Nicotine dependence, cigarettes, uncomplicated: Secondary | ICD-10-CM | POA: Insufficient documentation

## 2017-05-12 DIAGNOSIS — Z79899 Other long term (current) drug therapy: Secondary | ICD-10-CM | POA: Insufficient documentation

## 2017-05-12 DIAGNOSIS — R1310 Dysphagia, unspecified: Secondary | ICD-10-CM | POA: Insufficient documentation

## 2017-05-12 DIAGNOSIS — C3490 Malignant neoplasm of unspecified part of unspecified bronchus or lung: Secondary | ICD-10-CM

## 2017-05-12 DIAGNOSIS — Z85118 Personal history of other malignant neoplasm of bronchus and lung: Secondary | ICD-10-CM | POA: Insufficient documentation

## 2017-05-12 DIAGNOSIS — R05 Cough: Secondary | ICD-10-CM | POA: Insufficient documentation

## 2017-05-12 DIAGNOSIS — R590 Localized enlarged lymph nodes: Secondary | ICD-10-CM | POA: Insufficient documentation

## 2017-05-12 HISTORY — DX: Encounter for antineoplastic chemotherapy: Z51.11

## 2017-05-12 LAB — COMPREHENSIVE METABOLIC PANEL
ALT: 11 U/L (ref 0–55)
AST: 14 U/L (ref 5–34)
Albumin: 3.3 g/dL — ABNORMAL LOW (ref 3.5–5.0)
Alkaline Phosphatase: 171 U/L — ABNORMAL HIGH (ref 40–150)
Anion Gap: 11 mEq/L (ref 3–11)
BUN: 4.7 mg/dL — AB (ref 7.0–26.0)
CHLORIDE: 97 meq/L — AB (ref 98–109)
CO2: 26 meq/L (ref 22–29)
CREATININE: 0.7 mg/dL (ref 0.6–1.1)
Calcium: 10.3 mg/dL (ref 8.4–10.4)
EGFR: >90 mL/min/{1.73_m2} (ref >90)
GLUCOSE: 145 mg/dL — AB (ref 70–140)
Potassium: 3.6 mEq/L (ref 3.5–5.1)
Sodium: 134 mEq/L — ABNORMAL LOW (ref 136–145)
TOTAL PROTEIN: 7.6 g/dL (ref 6.4–8.3)
Total Bilirubin: 0.7 mg/dL (ref 0.20–1.20)

## 2017-05-12 LAB — CBC WITH DIFFERENTIAL/PLATELET
BASO%: 0.7 % (ref 0.0–2.0)
Basophils Absolute: 0.1 10*3/uL (ref 0.0–0.1)
EOS%: 0.3 % (ref 0.0–7.0)
Eosinophils Absolute: 0 10*3/uL (ref 0.0–0.5)
HEMATOCRIT: 41.7 % (ref 34.8–46.6)
HGB: 14.2 g/dL (ref 11.6–15.9)
LYMPH%: 20.9 % (ref 14.0–49.7)
MCH: 31.2 pg (ref 25.1–34.0)
MCHC: 34.1 g/dL (ref 31.5–36.0)
MCV: 91.7 fL (ref 79.5–101.0)
MONO#: 0.5 10*3/uL (ref 0.1–0.9)
MONO%: 5.3 % (ref 0.0–14.0)
NEUT%: 72.8 % (ref 38.4–76.8)
NEUTROS ABS: 6.9 10*3/uL — AB (ref 1.5–6.5)
Platelets: 508 10*3/uL — ABNORMAL HIGH (ref 145–400)
RBC: 4.54 10*6/uL (ref 3.70–5.45)
RDW: 13.9 % (ref 11.2–14.5)
WBC: 9.5 10*3/uL (ref 3.9–10.3)
lymph#: 2 10*3/uL (ref 0.9–3.3)

## 2017-05-12 MED ORDER — LIDOCAINE-PRILOCAINE 2.5-2.5 % EX CREA: 1.0000 "application " | TOPICAL_CREAM | CUTANEOUS | 0 refills | Status: AC | PRN

## 2017-05-12 MED ORDER — PROCHLORPERAZINE MALEATE 10 MG PO TABS: 10.0000 mg | ORAL_TABLET | Freq: Four times a day (QID) | ORAL | 0 refills | Status: DC | PRN

## 2017-05-12 NOTE — Progress Notes (Signed)
START ON PATHWAY REGIMEN - Non-Small Cell Lung     Administer weekly:     Paclitaxel      Carboplatin   **Always confirm dose/schedule in your pharmacy ordering system**    Patient Characteristics: Stage III - Unresectable, PS = 0, 1 AJCC T Category: T2b Current Disease Status: No Distant Mets or Local Recurrence AJCC N Category: N2 AJCC M Category: M0 AJCC 8 Stage Grouping: IIIA Performance Status: PS = 0, 1 Intent of Therapy: Curative Intent, Discussed with Patient

## 2017-05-12 NOTE — Progress Notes (Signed)
[place] Clinical Social Work  Clinical Social Work met with patient/family and Futures trader at [facility] appointment to offer support and assess for psychosocial needs.  Medical oncologist reviewed patient's diagnosis and recommended treatment plan with patient/family.  Patient was accompanied by her fiance.  The patient has two children, ages 83 and 43.  Ms. Harnack shared her main concern is getting started with treatment and does not feel she has any other needs at this time.    Clinical Social Work briefly discussed Clinical Social Work role and [employer] support programs/services.  Clinical Social Work encouraged patient to call with any additional questions or concerns.   Maryjean Morn, MSW, LCSW, OSW-C Clinical Social Worker [facility] [phone]

## 2017-05-12 NOTE — Telephone Encounter (Signed)
Scheduled appt per 9/13 los - Gave patient AVS and calender per los.  

## 2017-05-12 NOTE — Progress Notes (Signed)
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 female who has recently been diagnosed with non-small cell lung cancer. She initially presented with a cough for 6 weeks. Workup revealed a left lung nodule and mediastinal adenopathy   she underwent flexible bronchoscopy, brushings, large, endobronchial biopsy, endobronchial ultrasound, transbronchial needle aspiration to obtain diagnosis .She denies pain in chest area, reports feeling more discomfort. She does report having coughing, sometimes inhibits sleep at night. She said that she sleeps with her hands raised because she feels that she can breath better.   The patient was referred today for presentation in the multidisciplinary conference and evaluation in the multidisciplinary clinic.  Radiology studies and pathology slides were presented there for review and discussion of treatment options.  A consensus was discussed regarding potential next steps.   PREVIOUS RADIATION THERAPY: No  PAST MEDICAL HISTORY: Past Medical History:  Diagnosis Date  . Headache    hx migraines none in 10 yrs  . Lung mass    left hilary mass  . PONV (postoperative nausea and vomiting)   . UTI (urinary tract infection) 9 weeks ago    PAST SURGICAL HISTORY: Past Surgical History:  Procedure Laterality Date  . BREAST SURGERY Bilateral 2000   augmentation  . CESAREAN SECTION  2006   x 1   . ENDOBRONCHIAL ULTRASOUND Bilateral 05/03/2017   Procedure: ENDOBRONCHIAL ULTRASOUND;  Surgeon: Rigoberto Noel, MD;  Location: [facility] END[facility];   Service: Cardiopulmonary;  Laterality: Bilateral;    FAMILY HISTORY: family history is not on file.  SOCIAL HISTORY:  reports that she has been smoking Cigarettes.  She has a 22.00 pack-year smoking history. She has never used smokeless tobacco. She reports that she drinks alcohol. She reports that she does not use drugs. She lives in [place], [place]  ALLERGIES: Patient has no known allergies.  MEDICATIONS:  Current Outpatient Prescriptions  Medication Sig Dispense Refill  . levonorgestrel (MIRENA) 20 MCG/24HR IUD 1 each by Intrauterine route once. Inserted 8 yrs ago    . naproxen sodium (ANAPROX) 220 MG tablet Take 440 mg by mouth daily as needed (pain).     No current facility-administered medications for this encounter.     REVIEW OF SYSTEMS: REVIEW OF SYSTEMS: A 10+ POINT REVIEW OF SYSTEMS WAS OBTAINED including neurology, dermatology, psychiatry, cardiac, respiratory, lymph, extremities, GI, GU, musculoskeletal, constitutional, reproductive, HEENT. All pertinent positives are noted in the HPI. All others are negative.   PHYSICAL EXAM: Oncology Vitals 05/12/2017  Height 163 cm  Weight 71.215 kg  Weight (lbs) 157 lbs  BMI (kg/m2) 26.95 kg/m2  Temp 97.5  Pulse 97  Resp 19  SpO2 99  BSA (m2) 1.79 m2   General: Alert and oriented, in no acute distress HEENT: Head is normocephalic. Extraocular movements are intact. Oropharynx is clear. Neck: Neck is supple, no palpable cervical or supraclavicular lymphadenopathy. Heart: Regular in rate and rhythm with no murmurs, rubs, or gallops. Chest: Clear to auscultation bilaterally, with no rhonchi, wheezes, or rales. Abdomen: Soft, nontender, nondistended, with no rigidity or guarding. Extremities: No cyanosis or edema. Lymphatics: see  Neck Exam Skin: No concerning lesions. Musculoskeletal: symmetric strength and muscle tone throughout. Neurologic: Cranial nerves II through XII are grossly intact. No obvious focalities. Speech is fluent.  Coordination is intact. Psychiatric: Judgment and insight are intact. Affect is appropriate.      KPS = 80   LABORATORY DATA:  Lab Results  Component Value Date   WBC 13.9 (H) 04/15/2017   HGB 14.3 04/15/2017   HCT 43.2 04/15/2017   MCV 95.5 04/15/2017   PLT 559.0 (H) 04/15/2017   Lab Results  Component Value Date   NA 131 (L) 04/15/2017   K 3.7 04/15/2017   CL 96 04/15/2017   CO2 29 04/15/2017   Lab Results  Component Value Date   ALT 19 04/15/2017   AST 14 04/15/2017   ALKPHOS 160 (H) 04/15/2017   BILITOT 1.1 04/15/2017    PULMONARY FUNCTION TEST:   Recent Review Flowsheet Data    There is no flowsheet data to display.      RADIOGRAPHY: Ct Chest W Contrast  Result Date: 04/15/2017 CLINICAL DATA:  Persistent cough EXAM: CT CHEST WITH CONTRAST TECHNIQUE: Multidetector CT imaging of the chest was performed during intravenous contrast administration. CONTRAST:  17m ISOVUE-300 IOPAMIDOL (ISOVUE-300) INJECTION 61% COMPARISON:  None. FINDINGS: Cardiovascular: LEFT main pulmonary artery is compressed by LEFT hilar mass. No evidence of pulmonary embolism. Coronary calcifications noted. No pericardial Mediastinum/Nodes: No axillary supraclavicular adenopathy. Bulky LEFT hilar and Prevascular lymphadenopathy. Subcarinal adenopathy additionally. Example LEFT infrahilar nodal mass measures 4 cm. Prevascular node measures 2.3 cm. Subcarinal node measures 2 cm. Lungs/Pleura: Within the LEFT upper lobe oblong nodule measures 2.2 x 1.7 cm (image 67, series 3). Upper Abdomen: Adrenal glands are partially imaged. No hepatic lesion on partially imaged liver. Musculoskeletal: No aggressive osseous lesion. Bilateral subglandular breast implants. IMPRESSION: 1. 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 2. Metastatic adenopathy to the LEFT hilum, subcarinal nodal station and prevascular nodal stations. 3. No clear contralateral nodal metastasis or supraclavicular nodal  metastasis. 4. If multidisciplinary follow up management is desired, this is available in the [facility] the [facility] [phone] Consider FDG PET scan for staging and biopsy planning. These results will be called to the ordering clinician or representative by the Radiologist Assistant, and communication documented in the PACS or zVision Dashboard. Electronically Signed   By: SSuzy BouchardM.D.   On: 04/15/2017 16:56   Nm Pet Image Initial (pi) Skull Base To Thigh  Result Date: 04/26/2017 CLINICAL DATA:  Initial treatment strategy for enlarged lymph nodes. LEFT pulmonary nodule. EXAM: NUCLEAR MEDICINE PET SKULL BASE TO THIGH TECHNIQUE: 8.6 mCi F-18 FDG was injected intravenously. Full-ring PET imaging was performed from the skull base to thigh after the radiotracer. CT data was obtained and used for attenuation correction and anatomic localization. FASTING BLOOD GLUCOSE:  Value: 104 mg/dl COMPARISON:  CT 8178 FINDINGS: NECK No hypermetabolic lymph nodes in the neck. CHEST LEFT upper lobe lobular nodule measures 2.1 x 1.9 cm with intense metabolic activity (SUV max equal 8.9). No additional hypermetabolic nodules in the LEFT or RIGHT lung. LEFT hilar mass is intensely hypermetabolic measuring 4.2 cm with SUV max equal 15.5. Hypermetabolic prevascular lymph node measures 2.6 cm with SUV max equal 8.4. Equally intense hypermetabolic subcarinal / peribronchial node measuring 2.3 cm (image 71, series 4) No contralateral hypermetabolic pulmonary nodules. No hypermetabolic supraclavicular lymph nodes. Bilateral subglandular breast implants ABDOMEN/PELVIS No abnormal hypermetabolic activity within the liver, pancreas, adrenal glands, or spleen. No  hypermetabolic lymph nodes in the abdomen or pelvis. IUD within the uterus. Atherosclerotic calcification of the aorta. 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 SKELETON No focal hypermetabolic activity to suggest skeletal  metastasis. IMPRESSION: 1. Hypermetabolic LEFT upper lobe pulmonary nodule consistent primary bronchogenic carcinoma. 2. Bulky hypermetabolic LEFT hilar prevascular and subcarinal / peribronchial metastatic adenopathy. 3. No contralateral or  supraclavicular metastatic lymph nodes. Electronically Signed   By: Suzy Bouchard M.D.   On: 04/26/2017 16:53   Dg Chest Port 1 View  Result Date: 05/03/2017 CLINICAL DATA:  Status post bronchoscopy with left lung biopsy EXAM: PORTABLE CHEST 1 VIEW COMPARISON:  PET-CT April 26, 2017 ; chest CT April 15, 2017 FINDINGS: There is hazy opacity in the left mid lung region, possibly hemorrhage from lung biopsy. The mass noted previous in the left lower lobe is less well seen due to the patchy opacity in this area. There is extensive left hilar/perihilar adenopathy, unchanged from recent study. Right lung is clear. No evident pneumothorax. Heart size and pulmonary vascularity are normal. No evident bone lesions. IMPRESSION: 1. Ill-defined opacity in the region of the left lower lobe mass, likely hemorrhage from recent biopsy in this area. Mass less well seen than on recent CT examination. 2. Persistent left hilar/ perihilar adenopathy, grossly unchanged compared to recent CT. 3.  No pneumothorax. 4.  Right lung clear. 5.  Stable cardiac silhouette. Electronically Signed   By: Lowella Grip III M.D.   On: 05/03/2017 13:56   IMPRESSION/ PLAN: Stage III squamous of the left lung ( pending brain MRI). Patient is being scheduled for an MRI to complete her staging work up. Patient will be a good candidate for radiotherapy along with  Radiosensitizing chemotherapy.  I discussed the course of treatment and possible side effects as well as long-term toxicities, she appears to understand. Patient will be schedule for a simulation planning appointment on Tuesday September 18th 2018, with treatments to begin the week of September 24th.  anticiptipate 6 weeks of treatment followed by  chemotherapy/immunotherapy by Dr. Julien Nordmann.     ------------------------------------------------  Blair Promise, PhD, MD  This document serves as a record of services personally performed by Gery Pray MD. It was created on his behalf by Delton Coombes, a trained medical scribe. The creation of this record is based on the scribe's personal observations and the provider's statements to them. This document has been checked and approved by the attending provider.

## 2017-05-12 NOTE — Progress Notes (Signed)
[place] Telephone:([phone]) [phone]   [phone]) [phone] Multidisciplinary thoracic oncology clinic  CONSULT NOTE  REFERRING PHYSICIAN:Dr. Kara Mead  REASON FOR CONSULTATION:  46 years old white female recently diagnosed with lung cancer.  HPI Raven Cochran is a 46 y.o. female was past medical history significant for migraine headache and urinary tract infection. The patient mentioned that 2 months ago she was complaining of postnasal drainage as well as cough. She was treated with a course of antibiotics as well as cough medication with no improvement of her condition. She was seen at one of the urgent care center and chest x-ray showed suspicious opacity in the left lung. She was referred to Dr. Lillie Fragmin. This was followed by CT scan of the chest on 04/15/2017 and it showed bulky left hilar and prevascular lymphadenopathy. There was subcarinal adenopathy in addition to left infrahilar nodal mass measuring 4.0 cm, prevascular lymph node measuring 2.3 cm, subcarinal lymph node measuring 2.0 cm. There was also a nodule within the left upper lobe measuring 2.2 x 1.7 cm. A PET scan was performed on 04/26/2017 and it showed left upper lobe nodule measuring 2.1 x 1.9 cm with intense hypermetabolic activity with SUV max of 8.9. There was left hilar mass measuring 4.2 cm with SUV max of 00.7 and hypermetabolic prevascular lymph node measuring 2.6 cm with SUV max of 8.4. There was also equally intense hypermetabolic subcarinal/peribronchial node measuring 2.3 cm. On 05/03/2017 the patient underwent flexible bronchoscopy with brushings of the left upper lobe and left lower lobe bifurcation as well as bronchial alveolar lavage in addition to endobronchial biopsy and endobronchial ultrasound and transient needle aspiration of station 7 and 10 L. 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 Dr. Elsworth Soho kindly referred the patient to the multidisciplinary thoracic oncology clinic today for further evaluation and recommendation regarding treatment of her condition. When seen today the patient continues to complain of cough as well as shortness breath with exertion but no significant chest pain or hemoptysis. She has occasional nausea and vomiting with cough. She lost around 10 pounds in the last months. She denied having any current headache or visual changes. She has no fever or chills. Family history significant for father died from heart attack at age 46, brother died from aneurysm at age 65 and mother still alive at age 65 and has heart attack. The patient is single and has 2 children age 65 and 40. She was accompanied today by her fianc Mark. She has a history of smoking 1 pack per day for around 27 years and unfortunately she continues to smoke. She also used to drink alcohol at regular based but not recently. She denied having any history of drug abuse.  HPI  Past Medical History:  Diagnosis Date  . Headache    hx migraines none in 10 yrs  . Lung mass    left hilary mass  . PONV (postoperative nausea and vomiting)   . UTI (urinary tract infection) 9 weeks ago    Past Surgical History:  Procedure Laterality Date  . BREAST SURGERY Bilateral 2000   augmentation  . CESAREAN SECTION  2006   x 1   . ENDOBRONCHIAL ULTRASOUND Bilateral 05/03/2017   Procedure: ENDOBRONCHIAL ULTRASOUND;  Surgeon: Rigoberto Noel, MD;  Location: [facility];  Service: Cardiopulmonary;  Laterality: Bilateral;    History reviewed. No pertinent  family history.  Social History Social History  Substance Use Topics  . Smoking status: Current Every Day Smoker    Packs/day: 1.00    Years: 22.00    Types: Cigarettes  . Smokeless tobacco: Never Used  . Alcohol use Yes     Comment: 4 times week beer    No Known Allergies  Current Outpatient Prescriptions    Medication Sig Dispense Refill  . levonorgestrel (MIRENA) 20 MCG/24HR IUD 1 each by Intrauterine route once. Inserted 8 yrs ago    . naproxen sodium (ANAPROX) 220 MG tablet Take 440 mg by mouth daily as needed (pain).     No current facility-administered medications for this visit.     Review of Systems  Constitutional: positive for weight loss Eyes: negative Ears, nose, mouth, throat, and face: negative Respiratory: positive for cough and dyspnea on exertion Cardiovascular: negative Gastrointestinal: negative Genitourinary:negative Integument/breast: negative Hematologic/lymphatic: negative Musculoskeletal:negative Neurological: negative Behavioral/Psych: negative Endocrine: negative Allergic/Immunologic: negative  Physical Exam  OIT:GPQDI, healthy, no distress, well nourished and well developed SKIN: skin color, texture, turgor are normal, no rashes or significant lesions HEAD: Normocephalic, No masses, lesions, tenderness or abnormalities EYES: normal, PERRLA, Conjunctiva are pink and non-injected EARS: External ears normal, Canals clear OROPHARYNX:no exudate, no erythema and lips, buccal mucosa, and tongue normal  NECK: supple, no adenopathy, no JVD LYMPH:  no palpable lymphadenopathy, no hepatosplenomegaly BREAST:not examined LUNGS: clear to auscultation , and palpation HEART: regular rate & rhythm, no murmurs and no gallops ABDOMEN:abdomen soft, non-tender, normal bowel sounds and no masses or organomegaly BACK: Back symmetric, no curvature., No CVA tenderness EXTREMITIES:no joint deformities, effusion, or inflammation, no edema, no skin discoloration  NEURO: alert & oriented x 3 with fluent speech, no focal motor/sensory deficits  PERFORMANCE STATUS: ECOG 1  LABORATORY DATA: Lab Results  Component Value Date   WBC 9.5 05/12/2017   HGB 14.2 05/12/2017   HCT 41.7 05/12/2017   MCV 91.7 05/12/2017   PLT 508 (H) 05/12/2017      Chemistry      Component  Value Date/Time   NA 134 (L) 05/12/2017 1420   K 3.6 05/12/2017 1420   CL 96 04/15/2017 1444   CO2 26 05/12/2017 1420   BUN 4.7 (L) 05/12/2017 1420   CREATININE 0.7 05/12/2017 1420      Component Value Date/Time   CALCIUM 10.3 05/12/2017 1420   ALKPHOS 171 (H) 05/12/2017 1420   AST 14 05/12/2017 1420   ALT 11 05/12/2017 1420   BILITOT 0.70 05/12/2017 1420       RADIOGRAPHIC STUDIES: Ct Chest W Contrast  Result Date: 04/15/2017 CLINICAL DATA:  Persistent cough EXAM: CT CHEST WITH CONTRAST TECHNIQUE: Multidetector CT imaging of the chest was performed during intravenous contrast administration. CONTRAST:  85m ISOVUE-300 IOPAMIDOL (ISOVUE-300) INJECTION 61% COMPARISON:  None. FINDINGS: Cardiovascular: LEFT main pulmonary artery is compressed by LEFT hilar mass. No evidence of pulmonary embolism. Coronary calcifications noted. No pericardial Mediastinum/Nodes: No axillary supraclavicular adenopathy. Bulky LEFT hilar and Prevascular lymphadenopathy. Subcarinal adenopathy additionally. Example LEFT infrahilar nodal mass measures 4 cm. Prevascular node measures 2.3 cm. Subcarinal node measures 2 cm. Lungs/Pleura: Within the LEFT upper lobe oblong nodule measures 2.2 x 1.7 cm (image 67, series 3). Upper Abdomen: Adrenal glands are partially imaged. No hepatic lesion on partially imaged liver. Musculoskeletal: No aggressive osseous lesion. Bilateral subglandular breast implants. IMPRESSION: 1. 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 2. Metastatic adenopathy to the LEFT hilum, subcarinal nodal station and prevascular nodal  stations. 3. No clear contralateral nodal metastasis or supraclavicular nodal metastasis. 4. If multidisciplinary follow up management is desired, this is available in the [facility] through the Multidisciplinary Thoracic Clinic [phone]. Consider FDG PET scan for staging and biopsy planning. These results will be called to the ordering clinician or  representative by the Radiologist Assistant, and communication documented in the PACS or zVision Dashboard. Electronically Signed   By: Suzy Bouchard M.D.   On: 04/15/2017 16:56   Nm Pet Image Initial (pi) Skull Base To Thigh  Result Date: 04/26/2017 CLINICAL DATA:  Initial treatment strategy for enlarged lymph nodes. LEFT pulmonary nodule. EXAM: NUCLEAR MEDICINE PET SKULL BASE TO THIGH TECHNIQUE: 8.6 mCi F-18 FDG was injected intravenously. Full-ring PET imaging was performed from the skull base to thigh after the radiotracer. CT data was obtained and used for attenuation correction and anatomic localization. FASTING BLOOD GLUCOSE:  Value: 104 mg/dl COMPARISON:  CT 8178 FINDINGS: NECK No hypermetabolic lymph nodes in the neck. CHEST LEFT upper lobe lobular nodule measures 2.1 x 1.9 cm with intense metabolic activity (SUV max equal 8.9). No additional hypermetabolic nodules in the LEFT or RIGHT lung. LEFT hilar mass is intensely hypermetabolic measuring 4.2 cm with SUV max equal 15.5. Hypermetabolic prevascular lymph node measures 2.6 cm with SUV max equal 8.4. Equally intense hypermetabolic subcarinal / peribronchial node measuring 2.3 cm (image 71, series 4) No contralateral hypermetabolic pulmonary nodules. No hypermetabolic supraclavicular lymph nodes. Bilateral subglandular breast implants ABDOMEN/PELVIS No abnormal hypermetabolic activity within the liver, pancreas, adrenal glands, or spleen. No hypermetabolic lymph nodes in the abdomen or pelvis. IUD within the uterus. Atherosclerotic calcification of the aorta. 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 SKELETON No focal hypermetabolic activity to suggest skeletal metastasis. IMPRESSION: 1. Hypermetabolic LEFT upper lobe pulmonary nodule consistent primary bronchogenic carcinoma. 2. Bulky hypermetabolic LEFT hilar prevascular and subcarinal / peribronchial metastatic adenopathy. 3. No contralateral or  supraclavicular metastatic lymph nodes.  Electronically Signed   By: Suzy Bouchard M.D.   On: 04/26/2017 16:53   Dg Chest Port 1 View  Result Date: 05/03/2017 CLINICAL DATA:  Status post bronchoscopy with left lung biopsy EXAM: PORTABLE CHEST 1 VIEW COMPARISON:  PET-CT April 26, 2017 ; chest CT April 15, 2017 FINDINGS: There is hazy opacity in the left mid lung region, possibly hemorrhage from lung biopsy. The mass noted previous in the left lower lobe is less well seen due to the patchy opacity in this area. There is extensive left hilar/perihilar adenopathy, unchanged from recent study. Right lung is clear. No evident pneumothorax. Heart size and pulmonary vascularity are normal. No evident bone lesions. IMPRESSION: 1. Ill-defined opacity in the region of the left lower lobe mass, likely hemorrhage from recent biopsy in this area. Mass less well seen than on recent CT examination. 2. Persistent left hilar/ perihilar adenopathy, grossly unchanged compared to recent CT. 3.  No pneumothorax. 4.  Right lung clear. 5.  Stable cardiac silhouette. Electronically Signed   By: Lowella Grip III M.D.   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 I recommended for the  patient to complete the staging workup by ordering a MRI of the brain to rule out brain metastasis. I also ask the pathology department to send her tissue block for PDL 1 expression. I discussed with the patient her treatment options and goals of care. I recommended for the patient a course of concurrent chemoradiation with weekly carboplatin for AUC of 2 and  paclitaxel 45 MG/M2. I discussed with the patient adverse effect of the chemotherapy including but not limited to alopecia, myelosuppression, nausea and vomiting, peripheral neuropathy, liver or renal dysfunction. I will arrange for the patient to have a chemotherapy education class before starting the first dose of his chemotherapy. She is expected to start the first cycle of her treatment on 05/23/2017. I will also refer the patient to interventional radiology for consideration of Port-A-Cath placement. I will call her pharmacy with prescription for Compazine 10 mg by mouth every 6 hours as needed for nausea in addition to Emla cream. The patient would come back for follow-up visit in one week after the start of her chemotherapy for evaluation and management any adverse effect of her treatment. She was seen during the multidisciplinary thoracic oncology clinic today by medical oncology, radiation oncology, thoracic navigator, social worker and physical therapist. The patient was advised to call immediately if she has any concerning symptoms in the interval. The patient voices understanding of current disease status and treatment options and is in agreement with the current care plan.  All questions were answered. The patient knows to call the clinic with any problems, questions or concerns. We can certainly see the patient much sooner if necessary.  Thank you so much for allowing me to participate in the care of Ananda Caya. I will continue to follow up the patient with you and assist in her care.  I spent 55 minutes counseling the patient face to face. The total time spent in the appointment was 80 minutes.  Disclaimer: This note was dictated with voice recognition software. Similar sounding words can inadvertently be transcribed and may not be corrected upon review.   Eilleen Kempf May 12, 2017, 3:48 PM

## 2017-05-16 ENCOUNTER — Telehealth: Payer: Self-pay | Admitting: *Deleted

## 2017-05-16 NOTE — Telephone Encounter (Signed)
Oncology Nurse Navigator Documentation  Oncology Nurse Navigator Flowsheets 05/16/2017  Navigator Location [facility]  Navigator Encounter Type Telephone/I followed up on Raven Cochran's schedule. She needs MR Brain. I called central scheduling and was given an appt time. I called patient and updated on appt time and place. She verbalized understanding   Telephone Outgoing Call  Treatment Phase Pre-Tx/Tx Discussion  Barriers/Navigation Needs Coordination of Care;Education  Education Other  Interventions Coordination of Care;Education  Coordination of Care Appts  Education Method Verbal  Acuity Level 2  Time Spent with Patient 30

## 2017-05-17 ENCOUNTER — Ambulatory Visit
Admission: RE | Admit: 2017-05-17 | Discharge: 2017-05-17 | Disposition: A | Discharge status: Home / Self Care | Payer: Medicaid Other | Source: Ambulatory Visit | Attending: Radiation Oncology | Admitting: Radiation Oncology

## 2017-05-17 DIAGNOSIS — R05 Cough: Secondary | ICD-10-CM | POA: Diagnosis not present

## 2017-05-17 DIAGNOSIS — R1310 Dysphagia, unspecified: Secondary | ICD-10-CM | POA: Diagnosis not present

## 2017-05-17 DIAGNOSIS — C779 Secondary and unspecified malignant neoplasm of lymph node, unspecified: Secondary | ICD-10-CM | POA: Diagnosis not present

## 2017-05-17 DIAGNOSIS — Z9889 Other specified postprocedural states: Secondary | ICD-10-CM | POA: Diagnosis not present

## 2017-05-17 DIAGNOSIS — C3492 Malignant neoplasm of unspecified part of left bronchus or lung: Secondary | ICD-10-CM

## 2017-05-17 DIAGNOSIS — Z510 Encounter for antineoplastic radiation therapy: Secondary | ICD-10-CM | POA: Diagnosis present

## 2017-05-17 DIAGNOSIS — Y842 Radiological procedure and radiotherapy as the cause of abnormal reaction of the patient, or of later complication, without mention of misadventure at the time of the procedure: Secondary | ICD-10-CM | POA: Diagnosis not present

## 2017-05-17 DIAGNOSIS — R590 Localized enlarged lymph nodes: Secondary | ICD-10-CM | POA: Diagnosis not present

## 2017-05-17 DIAGNOSIS — F17210 Nicotine dependence, cigarettes, uncomplicated: Secondary | ICD-10-CM | POA: Diagnosis not present

## 2017-05-17 DIAGNOSIS — C3412 Malignant neoplasm of upper lobe, left bronchus or lung: Secondary | ICD-10-CM | POA: Diagnosis not present

## 2017-05-17 DIAGNOSIS — Z79899 Other long term (current) drug therapy: Secondary | ICD-10-CM | POA: Diagnosis not present

## 2017-05-17 DIAGNOSIS — Z85118 Personal history of other malignant neoplasm of bronchus and lung: Secondary | ICD-10-CM | POA: Diagnosis not present

## 2017-05-17 NOTE — Progress Notes (Signed)
  Radiation Oncology         [phone] ________________________________  Name: Raven Cochran MRN: [id number]  Date: 05/17/2017  DOB: 05-02-1971  SIMULATION AND TREATMENT PLANNING NOTE    ICD-10-CM   1. 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  Identity was confirmed.  All relevant records and images related to the planned course of therapy were reviewed.  The patient freely provided informed written consent to proceed with treatment after reviewing the details related to the planned course of therapy. The consent form was witnessed and verified by the simulation staff.  Then, the patient was set-up in a stable reproducible  supine position for radiation therapy.  CT images were obtained.  Surface markings were placed.  The CT images were loaded into the planning software.  Then the target and avoidance structures were contoured.  Treatment planning then occurred.  The radiation prescription was entered and confirmed.  Then, I designed and supervised the construction of a total of 5 medically necessary complex treatment devices.  I have requested : 3D Simulation  I have requested a DVH of the following structures: Heart, lungs, spinal cord, esophagus, planning target volume, gross target volume.  I have ordered:dose calc.  PLAN:  The patient will receive 60 Gy in 30 fractions along with radiosensitizing chemotherapy  -----------------------------------  Blair Promise, PhD, MD

## 2017-05-18 ENCOUNTER — Other Ambulatory Visit: Payer: Self-pay

## 2017-05-18 ENCOUNTER — Other Ambulatory Visit: Payer: Self-pay | Admitting: Radiology

## 2017-05-18 ENCOUNTER — Encounter: Payer: Self-pay | Admitting: *Deleted

## 2017-05-18 DIAGNOSIS — Z510 Encounter for antineoplastic radiation therapy: Secondary | ICD-10-CM | POA: Diagnosis present

## 2017-05-18 DIAGNOSIS — R1310 Dysphagia, unspecified: Secondary | ICD-10-CM | POA: Diagnosis not present

## 2017-05-18 DIAGNOSIS — Y842 Radiological procedure and radiotherapy as the cause of abnormal reaction of the patient, or of later complication, without mention of misadventure at the time of the procedure: Secondary | ICD-10-CM | POA: Diagnosis not present

## 2017-05-18 DIAGNOSIS — F17210 Nicotine dependence, cigarettes, uncomplicated: Secondary | ICD-10-CM | POA: Diagnosis not present

## 2017-05-18 DIAGNOSIS — Z85118 Personal history of other malignant neoplasm of bronchus and lung: Secondary | ICD-10-CM | POA: Diagnosis not present

## 2017-05-18 DIAGNOSIS — Z79899 Other long term (current) drug therapy: Secondary | ICD-10-CM | POA: Diagnosis not present

## 2017-05-18 DIAGNOSIS — R05 Cough: Secondary | ICD-10-CM | POA: Diagnosis not present

## 2017-05-18 DIAGNOSIS — Z9889 Other specified postprocedural states: Secondary | ICD-10-CM | POA: Diagnosis not present

## 2017-05-18 DIAGNOSIS — R590 Localized enlarged lymph nodes: Secondary | ICD-10-CM | POA: Diagnosis not present

## 2017-05-18 DIAGNOSIS — C3412 Malignant neoplasm of upper lobe, left bronchus or lung: Secondary | ICD-10-CM | POA: Diagnosis not present

## 2017-05-18 DIAGNOSIS — C779 Secondary and unspecified malignant neoplasm of lymph node, unspecified: Secondary | ICD-10-CM | POA: Diagnosis not present

## 2017-05-19 ENCOUNTER — Ambulatory Visit
Admission: RE | Admit: 2017-05-19 | Discharge: 2017-05-19 | Disposition: A | Discharge status: Home / Self Care | Payer: Medicaid Other | Source: Ambulatory Visit | Attending: Radiation Oncology | Admitting: Radiation Oncology

## 2017-05-19 ENCOUNTER — Ambulatory Visit (HOSPITAL_COMMUNITY)
Admission: RE | Admit: 2017-05-19 | Discharge: 2017-05-19 | Disposition: A | Discharge status: Home / Self Care | Payer: Medicaid Other | Source: Ambulatory Visit | Attending: Internal Medicine | Admitting: Internal Medicine

## 2017-05-19 ENCOUNTER — Other Ambulatory Visit: Payer: Self-pay | Admitting: Internal Medicine

## 2017-05-19 ENCOUNTER — Encounter (HOSPITAL_COMMUNITY): Payer: Self-pay

## 2017-05-19 DIAGNOSIS — F17210 Nicotine dependence, cigarettes, uncomplicated: Secondary | ICD-10-CM | POA: Diagnosis not present

## 2017-05-19 DIAGNOSIS — Z7189 Other specified counseling: Secondary | ICD-10-CM

## 2017-05-19 DIAGNOSIS — C3492 Malignant neoplasm of unspecified part of left bronchus or lung: Secondary | ICD-10-CM

## 2017-05-19 DIAGNOSIS — C3490 Malignant neoplasm of unspecified part of unspecified bronchus or lung: Secondary | ICD-10-CM

## 2017-05-19 DIAGNOSIS — Z5111 Encounter for antineoplastic chemotherapy: Secondary | ICD-10-CM

## 2017-05-19 DIAGNOSIS — C3412 Malignant neoplasm of upper lobe, left bronchus or lung: Secondary | ICD-10-CM | POA: Diagnosis not present

## 2017-05-19 DIAGNOSIS — Z510 Encounter for antineoplastic radiation therapy: Secondary | ICD-10-CM | POA: Diagnosis not present

## 2017-05-19 HISTORY — PX: IR FLUORO GUIDE PORT INSERTION RIGHT: IMG5741

## 2017-05-19 HISTORY — PX: IR US GUIDE VASC ACCESS RIGHT: IMG2390

## 2017-05-19 LAB — BASIC METABOLIC PANEL
Anion gap: 11 (ref 5–15)
BUN: 6 mg/dL (ref 6–20)
CHLORIDE: 97 mmol/L — AB (ref 101–111)
CO2: 24 mmol/L (ref 22–32)
CREATININE: 0.55 mg/dL (ref 0.44–1.00)
Calcium: 9.4 mg/dL (ref 8.9–10.3)
GFR calc Af Amer: >60 mL/min (ref >60)
GFR calc non Af Amer: >60 mL/min (ref >60)
Glucose, Bld: 98 mg/dL (ref 65–99)
Potassium: 3.8 mmol/L (ref 3.5–5.1)
SODIUM: 132 mmol/L — AB (ref 135–145)

## 2017-05-19 LAB — CBC
HCT: 36.9 % (ref 36.0–46.0)
Hemoglobin: 12.8 g/dL (ref 12.0–15.0)
MCH: 30.3 pg (ref 26.0–34.0)
MCHC: 34.7 g/dL (ref 30.0–36.0)
MCV: 87.4 fL (ref 78.0–100.0)
PLATELETS: 453 10*3/uL — AB (ref 150–400)
RBC: 4.22 MIL/uL (ref 3.87–5.11)
RDW: 13.2 % (ref 11.5–15.5)
WBC: 10.1 10*3/uL (ref 4.0–10.5)

## 2017-05-19 LAB — PROTIME-INR
INR: 1.02
Prothrombin Time: 13.3 seconds (ref 11.4–15.2)

## 2017-05-19 LAB — APTT: aPTT: 31 seconds (ref 24–36)

## 2017-05-19 MED ORDER — MIDAZOLAM HCL 2 MG/2ML IJ SOLN
INTRAMUSCULAR | Status: AC
  Filled 2017-05-19: qty 4

## 2017-05-19 MED ORDER — LIDOCAINE-EPINEPHRINE (PF) 2 %-1:200000 IJ SOLN
INTRAMUSCULAR | Status: AC | PRN
  Administered 2017-05-19: 20 mL

## 2017-05-19 MED ORDER — LIDOCAINE-EPINEPHRINE (PF) 2 %-1:200000 IJ SOLN
INTRAMUSCULAR | Status: AC
  Filled 2017-05-19: qty 20

## 2017-05-19 MED ORDER — FENTANYL CITRATE (PF) 100 MCG/2ML IJ SOLN
INTRAMUSCULAR | Status: AC | PRN
  Administered 2017-05-19 (×2): 50 ug via INTRAVENOUS

## 2017-05-19 MED ORDER — CEFAZOLIN SODIUM-DEXTROSE 2-4 GM/100ML-% IV SOLN
2.0000 g | INTRAVENOUS | Status: AC
  Administered 2017-05-19: 2 g via INTRAVENOUS

## 2017-05-19 MED ORDER — HEPARIN SOD (PORK) LOCK FLUSH 100 UNIT/ML IV SOLN
INTRAVENOUS | Status: AC
  Filled 2017-05-19: qty 5

## 2017-05-19 MED ORDER — SODIUM CHLORIDE 0.9 % IV SOLN
INTRAVENOUS | Status: DC
  Administered 2017-05-19: 11:00:00 via INTRAVENOUS

## 2017-05-19 MED ORDER — MIDAZOLAM HCL 2 MG/2ML IJ SOLN
INTRAMUSCULAR | Status: AC | PRN
  Administered 2017-05-19 (×2): 1 mg via INTRAVENOUS

## 2017-05-19 MED ORDER — HEPARIN SOD (PORK) LOCK FLUSH 100 UNIT/ML IV SOLN
INTRAVENOUS | Status: AC | PRN
  Administered 2017-05-19: 500 [IU]

## 2017-05-19 MED ORDER — CEFAZOLIN SODIUM-DEXTROSE 2-4 GM/100ML-% IV SOLN
INTRAVENOUS | Status: AC
  Filled 2017-05-19: qty 100

## 2017-05-19 MED ORDER — FENTANYL CITRATE (PF) 100 MCG/2ML IJ SOLN
INTRAMUSCULAR | Status: AC
  Filled 2017-05-19: qty 4

## 2017-05-19 NOTE — Procedures (Signed)
  Procedure:   R IJ Port placement   Preprocedure diagnosis:  Lung CA Postprocedure diagnosis:  same EBL:     minimal Complications:   none immediate  See full dictation in [facility].  Dillard Cannon MD Main # [phone] Pager  [phone]

## 2017-05-19 NOTE — Consult Note (Signed)
Chief Complaint: Patient was seen in consultation today for Port-A-Cath placement  Referring Physician(s): Mohamed,Mohamed  Supervising Physician: Arne Cleveland  Patient Status: [facility] - Out-pt  History of Present Illness: Raven Cochran is a 46 y.o. 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 She presents today for Port-A-Cath placement for chemotherapy.   Past Medical History:  Diagnosis Date  . Encounter for antineoplastic chemotherapy 05/12/2017  . Headache    hx migraines none in 10 yrs  . Lung mass    left hilary mass  . PONV (postoperative nausea and vomiting)   . UTI (urinary tract infection) 9 weeks ago    Past Surgical History:  Procedure Laterality Date  . BREAST SURGERY Bilateral 2000   augmentation  . CESAREAN SECTION  2006   x 1   . ENDOBRONCHIAL ULTRASOUND Bilateral 05/03/2017   Procedure: ENDOBRONCHIAL ULTRASOUND;  Surgeon: Rigoberto Noel, MD;  Location: [facility];  Service: Cardiopulmonary;  Laterality: Bilateral;    Allergies: Patient has no known allergies.  Medications: Prior to Admission medications   Medication Sig Start Date End Date Taking? Authorizing Provider  acetaminophen (TYLENOL) 500 MG tablet Take 500 mg by mouth every 6 (six) hours as needed for moderate pain.   Yes [provider]  naproxen sodium (ANAPROX) 220 MG tablet Take 440 mg by mouth daily as needed (pain).   Yes [provider]  levonorgestrel (MIRENA) 20 MCG/24HR IUD 1 each by Intrauterine route once. Inserted 8 yrs ago    [provider]  lidocaine-prilocaine (EMLA) cream Apply 1 application topically as needed. 05/12/17   Curt Bears, MD  prochlorperazine (COMPAZINE) 10 MG tablet Take 1 tablet (10 mg total) by mouth every 6 (six) hours as needed  for nausea or vomiting. 05/12/17   Curt Bears, MD     History reviewed. No pertinent family history.  Social History   Social History  . Marital status: Divorced    Spouse name: N/A  . Number of children: N/A  . Years of education: N/A   Social History Main Topics  . Smoking status: Current Every Day Smoker    Packs/day: 1.00    Years: 22.00    Types: Cigarettes  . Smokeless tobacco: Never Used  . Alcohol use Yes     Comment: 4 times week beer  . Drug use: No  . Sexual activity: Yes     Comment: mirena uid, boyfrined vasectomy   Other Topics Concern  . None   Social History Narrative  . None      Review of Systems currently denies fever, headache, abdominal pain, or abnormal bleeding. She does have some occasional chest discomfort ,occasional dyspnea with exertion/cough, back pain and intermittent nausea and vomiting  Vital Signs: BP (!) 149/82 (BP Location: Right Arm)   Pulse 88   Temp 98.2 F (36.8 C) (Oral)   Resp 16   LMP  (LMP Unknown) Comment: iud  SpO2 98%   Physical Exam awake, alert. Chest with diminished breath sounds on left, right clear. Heart with regular rate and rhythm. Abdomen soft, positive bowel sounds, nontender. No lower extremity edema.  Imaging: Nm Pet Image Initial (pi) Skull Base To Thigh  Result Date: 04/26/2017 CLINICAL DATA:  Initial treatment strategy for enlarged lymph nodes. LEFT pulmonary nodule. EXAM: NUCLEAR MEDICINE PET SKULL BASE TO THIGH  TECHNIQUE: 8.6 mCi F-18 FDG was injected intravenously. Full-ring PET imaging was performed from the skull base to thigh after the radiotracer. CT data was obtained and used for attenuation correction and anatomic localization. FASTING BLOOD GLUCOSE:  Value: 104 mg/dl COMPARISON:  CT 8178 FINDINGS: NECK No hypermetabolic lymph nodes in the neck. CHEST LEFT upper lobe lobular nodule measures 2.1 x 1.9 cm with intense metabolic activity (SUV max equal 8.9). No additional hypermetabolic nodules  in the LEFT or RIGHT lung. LEFT hilar mass is intensely hypermetabolic measuring 4.2 cm with SUV max equal 15.5. Hypermetabolic prevascular lymph node measures 2.6 cm with SUV max equal 8.4. Equally intense hypermetabolic subcarinal / peribronchial node measuring 2.3 cm (image 71, series 4) No contralateral hypermetabolic pulmonary nodules. No hypermetabolic supraclavicular lymph nodes. Bilateral subglandular breast implants ABDOMEN/PELVIS No abnormal hypermetabolic activity within the liver, pancreas, adrenal glands, or spleen. No hypermetabolic lymph nodes in the abdomen or pelvis. IUD within the uterus. Atherosclerotic calcification of the aorta. 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 SKELETON No focal hypermetabolic activity to suggest skeletal metastasis. IMPRESSION: 1. Hypermetabolic LEFT upper lobe pulmonary nodule consistent primary bronchogenic carcinoma. 2. Bulky hypermetabolic LEFT hilar prevascular and subcarinal / peribronchial metastatic adenopathy. 3. No contralateral or  supraclavicular metastatic lymph nodes. Electronically Signed   By: Suzy Bouchard M.D.   On: 04/26/2017 16:53   Dg Chest Port 1 View  Result Date: 05/03/2017 CLINICAL DATA:  Status post bronchoscopy with left lung biopsy EXAM: PORTABLE CHEST 1 VIEW COMPARISON:  PET-CT April 26, 2017 ; chest CT April 15, 2017 FINDINGS: There is hazy opacity in the left mid lung region, possibly hemorrhage from lung biopsy. The mass noted previous in the left lower lobe is less well seen due to the patchy opacity in this area. There is extensive left hilar/perihilar adenopathy, unchanged from recent study. Right lung is clear. No evident pneumothorax. Heart size and pulmonary vascularity are normal. No evident bone lesions. IMPRESSION: 1. Ill-defined opacity in the region of the left lower lobe mass, likely hemorrhage from recent biopsy in this area. Mass less well seen than on recent CT examination. 2. Persistent left hilar/ perihilar  adenopathy, grossly unchanged compared to recent CT. 3.  No pneumothorax. 4.  Right lung clear. 5.  Stable cardiac silhouette. Electronically Signed   By: Lowella Grip III M.D.   On: 05/03/2017 13:56    Labs:  CBC:  Recent Labs  04/15/17 1444 05/12/17 1420  WBC 13.9* 9.5  HGB 14.3 14.2  HCT 43.2 41.7  PLT 559.0* 508*    COAGS: No results for input(s): INR, APTT in the last 8760 hours.  BMP:  Recent Labs  04/15/17 1444 05/12/17 1420  NA 131* 134*  K 3.7 3.6  CL 96  --   CO2 29 26  GLUCOSE 93 145*  BUN 7 4.7*  CALCIUM 9.2 10.3  CREATININE 0.57 0.7    LIVER FUNCTION TESTS:  Recent Labs  04/15/17 1444 05/12/17 1420  BILITOT 1.1 0.70  AST 14 14  ALT 19 11  ALKPHOS 160* 171*  PROT 6.3 7.6  ALBUMIN 3.4* 3.3*    TUMOR MARKERS: No results for input(s): AFPTM, CEA, CA199, CHROMGRNA in the last 8760 hours.  Assessment and Plan: 46 y.o. 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 She presents today for Port-A-Cath placement for  chemotherapy. Risks and benefits discussed with the patient/sig other including, but not limited to bleeding, infection, pneumothorax, or fibrin sheath development and need for additional procedures.All of the patient's questions were answered, patient is agreeable to proceed.Consent signed and in chart.     Thank you for this interesting consult.  I greatly enjoyed meeting Miela Desjardin and look forward to participating in their care.  A copy of this report was sent to the requesting provider on this date.  Electronically Signed: D. Rowe Robert, PA-C 05/19/2017, 11:13 AM   I spent a total of 25 minutes in face to face in clinical consultation, greater than 50% of which was counseling/coordinating care for Port-A-Cath placement

## 2017-05-19 NOTE — Progress Notes (Signed)
  Radiation Oncology         [phone] ________________________________  Name: Raven Cochran MRN: [id number]  Date: 05/19/2017  DOB: 06-May-1971  Simulation Verification Note    ICD-10-CM   1. 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 The clinical setup was verified. Then port films were obtained and uploaded to the radiation oncology medical record software.  The treatment beams were carefully compared against the planned radiation fields. The position location and shape of the radiation fields was reviewed. They targeted volume of tissue appears to be appropriately covered by the radiation beams. Organs at risk appear to be excluded as planned.  Based on my personal review, I approved the simulation verification. The patient's treatment will proceed as planned.  -----------------------------------  Blair Promise, PhD, MD

## 2017-05-19 NOTE — Sedation Documentation (Signed)
Pt reports pain.

## 2017-05-19 NOTE — Discharge Instructions (Signed)
Moderate Conscious Sedation, Adult, Care After °These instructions provide you with information about caring for yourself after your procedure. Your health care provider may also give you more specific instructions. Your treatment has been planned according to current medical practices, but problems sometimes occur. Call your health care provider if you have any problems or questions after your procedure. °What can I expect after the procedure? °After your procedure, it is common: °· To feel sleepy for several hours. °· To feel clumsy and have poor balance for several hours. °· To have poor judgment for several hours. °· To vomit if you eat too soon. ° °Follow these instructions at home: °For at least 24 hours after the procedure: ° °· Do not: °? Participate in activities where you could fall or become injured. °? Drive. °? Use heavy machinery. °? Drink alcohol. °? Take sleeping pills or medicines that cause drowsiness. °? Make important decisions or sign legal documents. °? Take care of children on your own. °· Rest. °Eating and drinking °· Follow the diet recommended by your health care provider. °· If you vomit: °? Drink water, juice, or soup when you can drink without vomiting. °? Make sure you have little or no nausea before eating solid foods. °General instructions °· Have a responsible adult stay with you until you are awake and alert. °· Take over-the-counter and prescription medicines only as told by your health care provider. °· If you smoke, do not smoke without supervision. °· Keep all follow-up visits as told by your health care provider. This is important. °Contact a health care provider if: °· You keep feeling nauseous or you keep vomiting. °· You feel light-headed. °· You develop a rash. °· You have a fever. °Get help right away if: °· You have trouble breathing. °This information is not intended to replace advice given to you by your health care provider. Make sure you discuss any questions you have  with your health care provider. °Document Released: 06/06/2013 Document Revised: 01/19/2016 Document Reviewed: 12/06/2015 °Elsevier Interactive Patient Education © 2018 [employer] ° ° °Implanted Port Home Guide °An implanted port is a type of central line that is placed under the skin. Central lines are used to provide IV access when treatment or nutrition needs to be given through a person’s veins. Implanted ports are used for long-term IV access. An implanted port may be placed because: °· You need IV medicine that would be irritating to the small veins in your hands or arms. °· You need long-term IV medicines, such as antibiotics. °· You need IV nutrition for a long period. °· You need frequent blood draws for lab tests. °· You need dialysis. ° °Implanted ports are usually placed in the chest area, but they can also be placed in the upper arm, the abdomen, or the leg. An implanted port has two main parts: °· Reservoir. The reservoir is round and will appear as a small, raised area under your skin. The reservoir is the part where a needle is inserted to give medicines or draw blood. °· Catheter. The catheter is a thin, flexible tube that extends from the reservoir. The catheter is placed into a large vein. Medicine that is inserted into the reservoir goes into the catheter and then into the vein. ° °How will I care for my incision site? °Do not get the incision site wet. Bathe or shower as directed by your health care provider. °How is my port accessed? °Special steps must be taken to access the   port: °· Before the port is accessed, a numbing cream can be placed on the skin. This helps numb the skin over the port site. °· Your health care provider uses a sterile technique to access the port. °? Your health care provider must put on a mask and sterile gloves. °? The skin over your port is cleaned carefully with an antiseptic and allowed to dry. °? The port is gently pinched between sterile gloves, and a needle  is inserted into the port. °· Only "non-coring" port needles should be used to access the port. Once the port is accessed, a blood return should be checked. This helps ensure that the port is in the vein and is not clogged. °· If your port needs to remain accessed for a constant infusion, a clear (transparent) bandage will be placed over the needle site. The bandage and needle will need to be changed every week, or as directed by your health care provider. °· Keep the bandage covering the needle clean and dry. Do not get it wet. Follow your health care provider’s instructions on how to take a shower or bath while the port is accessed. °· If your port does not need to stay accessed, no bandage is needed over the port. ° °What is flushing? °Flushing helps keep the port from getting clogged. Follow your health care provider’s instructions on how and when to flush the port. Ports are usually flushed with saline solution or a medicine called heparin. The need for flushing will depend on how the port is used. °· If the port is used for intermittent medicines or blood draws, the port will need to be flushed: °? After medicines have been given. °? After blood has been drawn. °? As part of routine maintenance. °· If a constant infusion is running, the port may not need to be flushed. ° °How long will my port stay implanted? °The port can stay in for as long as your health care provider thinks it is needed. When it is time for the port to come out, surgery will be done to remove it. The procedure is similar to the one performed when the port was put in. °When should I seek immediate medical care? °When you have an implanted port, you should seek immediate medical care if: °· You notice a bad smell coming from the incision site. °· You have swelling, redness, or drainage at the incision site. °· You have more swelling or pain at the port site or the surrounding area. °· You have a fever that is not controlled with  medicine. ° °This information is not intended to replace advice given to you by your health care provider. Make sure you discuss any questions you have with your health care provider. °Document Released: 08/16/2005 Document Revised: 01/22/2016 Document Reviewed: 04/23/2013 °Elsevier Interactive Patient Education © 2017 [employer] ° ° °Implanted Port Insertion, Care After °This sheet gives you information about how to care for yourself after your procedure. Your health care provider may also give you more specific instructions. If you have problems or questions, contact your health care provider. °What can I expect after the procedure? °After your procedure, it is common to have: °· Discomfort at the port insertion site. °· Bruising on the skin over the port. This should improve over 3-4 days. ° °Follow these instructions at home: °Port care °· After your port is placed, you will get a manufacturer's information card. The card has information about your port. Keep this card with   you at all times.  Take care of the port as told by your health care provider. Ask your health care provider if you or a family member can get training for taking care of the port at home. A home health care nurse may also take care of the port.  Make sure to remember what type of port you have. Incision care  Follow instructions from your health care provider about how to take care of your port insertion site. Make sure you: ? Wash your hands with soap and water before you change your bandage (dressing). If soap and water are not available, use hand sanitizer. ? Change your dressing as told by your health care provider.  You may remove dressing tomorrow 05/19/17 ? Leave skin glue in place. These skin closures may need to stay in place for 2 weeks or longer.  Do not remove unless your health care provider tells you to do that.  Check your port insertion site every day for signs of infection. Check for: ? More redness, swelling,  or pain. ? More fluid or blood. ? Warmth. ? Pus or a bad smell. General instructions  Do not take baths, swim, or use a hot tub until your health care provider approves.  You may shower tomorrow.  Do not lift anything that is heavier than 10 lb (4.5 kg) for a week, or as told by your health care provider.  Ask your health care provider when it is okay to: ? Return to work or school. ? Resume usual physical activities or sports.  Do not drive for 24 hours if you were given a medicine to help you relax (sedative).  Take over-the-counter and prescription medicines only as told by your health care provider.  Wear a medical alert bracelet in case of an emergency. This will tell any health care providers that you have a port.  Keep all follow-up visits as told by your health care provider. This is important. Contact a health care provider if:  You have a fever or chills.  You have more redness, swelling, or pain around your port insertion site.  You have more fluid or blood coming from your port insertion site.  Your port insertion site feels warm to the touch.  You have pus or a bad smell coming from the port insertion site. Get help right away if:  You have chest pain or shortness of breath.  You have bleeding from your port that you cannot control. Summary  Take care of the port as told by your health care provider.  Change your dressing as told by your health care provider.  Keep all follow-up visits as told by your health care provider. This information is not intended to replace advice given to you by your health care provider. Make sure you discuss any questions you have with your health care provider. Document Released: 06/06/2013 Document Revised: 07/07/2016 Document Reviewed: 07/07/2016 Elsevier Interactive Patient Education  2017 [employer].

## 2017-05-20 ENCOUNTER — Encounter (HOSPITAL_COMMUNITY): Payer: Self-pay | Admitting: Interventional Radiology

## 2017-05-20 ENCOUNTER — Ambulatory Visit
Admission: RE | Admit: 2017-05-20 | Discharge: 2017-05-20 | Disposition: A | Discharge status: Home / Self Care | Payer: Medicaid Other | Source: Ambulatory Visit | Attending: Radiation Oncology | Admitting: Radiation Oncology

## 2017-05-20 ENCOUNTER — Ambulatory Visit (HOSPITAL_COMMUNITY)
Admission: RE | Admit: 2017-05-20 | Discharge: 2017-05-20 | Disposition: A | Discharge status: Home / Self Care | Payer: Medicaid Other | Source: Ambulatory Visit | Attending: Internal Medicine | Admitting: Internal Medicine

## 2017-05-20 DIAGNOSIS — Z5111 Encounter for antineoplastic chemotherapy: Secondary | ICD-10-CM

## 2017-05-20 DIAGNOSIS — Z510 Encounter for antineoplastic radiation therapy: Secondary | ICD-10-CM | POA: Diagnosis not present

## 2017-05-20 DIAGNOSIS — C3490 Malignant neoplasm of unspecified part of unspecified bronchus or lung: Secondary | ICD-10-CM

## 2017-05-20 DIAGNOSIS — C3492 Malignant neoplasm of unspecified part of left bronchus or lung: Secondary | ICD-10-CM

## 2017-05-20 DIAGNOSIS — Z7189 Other specified counseling: Secondary | ICD-10-CM

## 2017-05-20 MED ORDER — GADOBENATE DIMEGLUMINE 529 MG/ML IV SOLN
15.0000 mL | Freq: Once | INTRAVENOUS | Status: AC | PRN
  Administered 2017-05-20: 15 mL via INTRAVENOUS

## 2017-05-23 ENCOUNTER — Ambulatory Visit
Admission: RE | Admit: 2017-05-23 | Discharge: 2017-05-23 | Disposition: A | Discharge status: Home / Self Care | Payer: Medicaid Other | Source: Ambulatory Visit | Attending: Radiation Oncology | Admitting: Radiation Oncology

## 2017-05-23 ENCOUNTER — Other Ambulatory Visit (HOSPITAL_BASED_OUTPATIENT_CLINIC_OR_DEPARTMENT_OTHER): Payer: Medicaid Other

## 2017-05-23 ENCOUNTER — Encounter: Payer: Self-pay | Admitting: *Deleted

## 2017-05-23 ENCOUNTER — Ambulatory Visit (HOSPITAL_BASED_OUTPATIENT_CLINIC_OR_DEPARTMENT_OTHER): Payer: Medicaid Other

## 2017-05-23 ENCOUNTER — Encounter (HOSPITAL_COMMUNITY): Payer: Self-pay

## 2017-05-23 VITALS — BP 124/80 | HR 89 | Temp 98.0°F | Resp 16 | Wt 157.8 lb

## 2017-05-23 DIAGNOSIS — Z5111 Encounter for antineoplastic chemotherapy: Secondary | ICD-10-CM

## 2017-05-23 DIAGNOSIS — C3412 Malignant neoplasm of upper lobe, left bronchus or lung: Secondary | ICD-10-CM

## 2017-05-23 DIAGNOSIS — C3492 Malignant neoplasm of unspecified part of left bronchus or lung: Secondary | ICD-10-CM

## 2017-05-23 DIAGNOSIS — C3490 Malignant neoplasm of unspecified part of unspecified bronchus or lung: Secondary | ICD-10-CM

## 2017-05-23 DIAGNOSIS — Z7189 Other specified counseling: Secondary | ICD-10-CM

## 2017-05-23 DIAGNOSIS — Z510 Encounter for antineoplastic radiation therapy: Secondary | ICD-10-CM | POA: Diagnosis not present

## 2017-05-23 LAB — CBC WITH DIFFERENTIAL/PLATELET
BASO%: 0.1 % (ref 0.0–2.0)
BASOS ABS: 0 10*3/uL (ref 0.0–0.1)
EOS ABS: 0.1 10*3/uL (ref 0.0–0.5)
EOS%: 0.7 % (ref 0.0–7.0)
HEMATOCRIT: 36.9 % (ref 34.8–46.6)
HGB: 12.4 g/dL (ref 11.6–15.9)
LYMPH#: 1.7 10*3/uL (ref 0.9–3.3)
LYMPH%: 15.8 % (ref 14.0–49.7)
MCH: 30.3 pg (ref 25.1–34.0)
MCHC: 33.6 g/dL (ref 31.5–36.0)
MCV: 90.2 fL (ref 79.5–101.0)
MONO#: 0.7 10*3/uL (ref 0.1–0.9)
MONO%: 6.3 % (ref 0.0–14.0)
NEUT#: 8.5 10*3/uL — ABNORMAL HIGH (ref 1.5–6.5)
NEUT%: 77.1 % — AB (ref 38.4–76.8)
PLATELETS: 492 10*3/uL — AB (ref 145–400)
RBC: 4.09 10*6/uL (ref 3.70–5.45)
RDW: 13.3 % (ref 11.2–14.5)
WBC: 11 10*3/uL — ABNORMAL HIGH (ref 3.9–10.3)

## 2017-05-23 LAB — COMPREHENSIVE METABOLIC PANEL
ALBUMIN: 2.9 g/dL — AB (ref 3.5–5.0)
ALT: 13 U/L (ref 0–55)
ANION GAP: 9 meq/L (ref 3–11)
AST: 17 U/L (ref 5–34)
Alkaline Phosphatase: 142 U/L (ref 40–150)
BUN: 5.7 mg/dL — ABNORMAL LOW (ref 7.0–26.0)
CALCIUM: 9.6 mg/dL (ref 8.4–10.4)
CHLORIDE: 97 meq/L — AB (ref 98–109)
CO2: 25 mEq/L (ref 22–29)
CREATININE: 0.6 mg/dL (ref 0.6–1.1)
EGFR: >90 mL/min/{1.73_m2} (ref >90)
Glucose: 69 mg/dl — ABNORMAL LOW (ref 70–140)
POTASSIUM: 3.8 meq/L (ref 3.5–5.1)
Sodium: 131 mEq/L — ABNORMAL LOW (ref 136–145)
Total Bilirubin: 0.65 mg/dL (ref 0.20–1.20)
Total Protein: 6.9 g/dL (ref 6.4–8.3)

## 2017-05-23 MED ORDER — FAMOTIDINE IN NACL 20-0.9 MG/50ML-% IV SOLN
20.0000 mg | Freq: Once | INTRAVENOUS | Status: AC
  Administered 2017-05-23: 20 mg via INTRAVENOUS

## 2017-05-23 MED ORDER — PACLITAXEL CHEMO INJECTION 300 MG/50ML
45.0000 mg/m2 | Freq: Once | INTRAVENOUS | Status: AC
  Administered 2017-05-23: 78 mg via INTRAVENOUS
  Filled 2017-05-23: qty 13

## 2017-05-23 MED ORDER — PALONOSETRON HCL INJECTION 0.25 MG/5ML
0.2500 mg | Freq: Once | INTRAVENOUS | Status: AC
  Administered 2017-05-23: 0.25 mg via INTRAVENOUS

## 2017-05-23 MED ORDER — FAMOTIDINE IN NACL 20-0.9 MG/50ML-% IV SOLN
INTRAVENOUS | Status: AC
Start: 2017-05-23 — End: 2017-05-23
  Filled 2017-05-23: qty 50

## 2017-05-23 MED ORDER — SODIUM CHLORIDE 0.9 % IV SOLN
Freq: Once | INTRAVENOUS | Status: AC
  Administered 2017-05-23: 10:00:00 via INTRAVENOUS

## 2017-05-23 MED ORDER — SODIUM CHLORIDE 0.9 % IV SOLN
243.4000 mg | Freq: Once | INTRAVENOUS | Status: AC
  Administered 2017-05-23: 240 mg via INTRAVENOUS
  Filled 2017-05-23: qty 24

## 2017-05-23 MED ORDER — DIPHENHYDRAMINE HCL 50 MG/ML IJ SOLN
50.0000 mg | Freq: Once | INTRAMUSCULAR | Status: AC
  Administered 2017-05-23: 50 mg via INTRAVENOUS

## 2017-05-23 MED ORDER — HEPARIN SOD (PORK) LOCK FLUSH 100 UNIT/ML IV SOLN
500.0000 [IU] | Freq: Once | INTRAVENOUS | Status: AC | PRN
  Administered 2017-05-23: 500 [IU]
  Filled 2017-05-23: qty 5

## 2017-05-23 MED ORDER — DIPHENHYDRAMINE HCL 50 MG/ML IJ SOLN
INTRAMUSCULAR | Status: AC
  Filled 2017-05-23: qty 1

## 2017-05-23 MED ORDER — SODIUM CHLORIDE 0.9% FLUSH
10.0000 mL | INTRAVENOUS | Status: DC | PRN
Start: 2017-05-23 — End: 2017-05-23
  Administered 2017-05-23: 10 mL
  Filled 2017-05-23: qty 10

## 2017-05-23 MED ORDER — PALONOSETRON HCL INJECTION 0.25 MG/5ML
INTRAVENOUS | Status: AC
  Filled 2017-05-23: qty 5

## 2017-05-23 MED ORDER — SODIUM CHLORIDE 0.9 % IV SOLN
20.0000 mg | Freq: Once | INTRAVENOUS | Status: AC
  Administered 2017-05-23: 20 mg via INTRAVENOUS
  Filled 2017-05-23: qty 2

## 2017-05-23 NOTE — Progress Notes (Signed)
Pt complaining of cough, requesting cough medicine. Spoke with [employer] engineer, she came to tx area to speak with pt.   Pt glucose 69 today. Pt asymptomatic. Pt is eating and drinking Coke in tx area.

## 2017-05-23 NOTE — Progress Notes (Signed)
Oncology Nurse Navigator Documentation  Oncology Nurse Navigator Flowsheets 05/23/2017  Navigator Location [facility]  Navigator Encounter Type Clinic/MDC/Today is patient's first chemo treatment. Spoke with her offered education and encouragement.   Abnormal Finding Date 04/15/2017  Confirmed Diagnosis Date 05/03/2017  Multidisiplinary Clinic Date 05/12/2017  Treatment Initiated Date 05/17/2017  Patient Visit Type MedOnc  Treatment Phase First Chemo Tx  Barriers/Navigation Needs Education  Education Other  Interventions Education  Coordination of Care Other  Education Method Verbal  Acuity Level 2  Time Spent with Patient 30

## 2017-05-23 NOTE — Patient Instructions (Signed)
[facility] Discharge Instructions for Patients Receiving Chemotherapy  Today you received the following chemotherapy agents Taxol and Carboplatin  To help prevent nausea and vomiting after your treatment, we encourage you to take your nausea medication as prescribed.  If you develop nausea and vomiting that is not controlled by your nausea medication, call the clinic.   BELOW ARE SYMPTOMS THAT SHOULD BE REPORTED IMMEDIATELY:  *FEVER GREATER THAN 100.5 F  *CHILLS WITH OR WITHOUT FEVER  NAUSEA AND VOMITING THAT IS NOT CONTROLLED WITH YOUR NAUSEA MEDICATION  *UNUSUAL SHORTNESS OF BREATH  *UNUSUAL BRUISING OR BLEEDING  TENDERNESS IN MOUTH AND THROAT WITH OR WITHOUT PRESENCE OF ULCERS  *URINARY PROBLEMS  *BOWEL PROBLEMS  UNUSUAL RASH Items with * indicate a potential emergency and should be followed up as soon as possible.  Feel free to call the clinic should you have any questions or concerns. The clinic phone number is [phone].  Please show the [place] at check-in to the Emergency Department and triage nurse.  Paclitaxel injection (Taxol) What is this medicine? PACLITAXEL (PAK li TAX el) is a chemotherapy drug. It targets fast dividing cells, like cancer cells, and causes these cells to die. This medicine is used to treat ovarian cancer, breast cancer, and other cancers. This medicine may be used for other purposes; ask your health care provider or pharmacist if you have questions. COMMON BRAND NAME(S): Onxol, Taxol What should I tell my health care provider before I take this medicine? They need to know if you have any of these conditions: -blood disorders -irregular heartbeat -infection (especially a virus infection such as chickenpox, cold sores, or herpes) -liver disease -previous or ongoing radiation therapy -an unusual or allergic reaction to paclitaxel, alcohol, polyoxyethylated castor oil, other chemotherapy agents, other medicines,  foods, dyes, or preservatives -pregnant or trying to get pregnant -breast-feeding How should I use this medicine? This drug is given as an infusion into a vein. It is administered in a hospital or clinic by a specially trained health care professional. Talk to your pediatrician regarding the use of this medicine in children. Special care may be needed. Overdosage: If you think you have taken too much of this medicine contact a poison control center or emergency room at once. NOTE: This medicine is only for you. Do not share this medicine with others. What if I miss a dose? It is important not to miss your dose. Call your doctor or health care professional if you are unable to keep an appointment. What may interact with this medicine? Do not take this medicine with any of the following medications: -disulfiram -metronidazole This medicine may also interact with the following medications: -cyclosporine -diazepam -ketoconazole -medicines to increase blood counts like filgrastim, pegfilgrastim, sargramostim -other chemotherapy drugs like cisplatin, doxorubicin, epirubicin, etoposide, teniposide, vincristine -quinidine -testosterone -vaccines -verapamil Talk to your doctor or health care professional before taking any of these medicines: -acetaminophen -aspirin -ibuprofen -ketoprofen -naproxen This list may not describe all possible interactions. Give your health care provider a list of all the medicines, herbs, non-prescription drugs, or dietary supplements you use. Also tell them if you smoke, drink alcohol, or use illegal drugs. Some items may interact with your medicine. What should I watch for while using this medicine? Your condition will be monitored carefully while you are receiving this medicine. You will need important blood work done while you are taking this medicine. This medicine can cause serious allergic reactions. To reduce your risk you will need  to take other  medicine(s) before treatment with this medicine. If you experience allergic reactions like skin rash, itching or hives, swelling of the face, lips, or tongue, tell your doctor or health care professional right away. In some cases, you may be given additional medicines to help with side effects. Follow all directions for their use. This drug may make you feel generally unwell. This is not uncommon, as chemotherapy can affect healthy cells as well as cancer cells. Report any side effects. Continue your course of treatment even though you feel ill unless your doctor tells you to stop. Call your doctor or health care professional for advice if you get a fever, chills or sore throat, or other symptoms of a cold or flu. Do not treat yourself. This drug decreases your body's ability to fight infections. Try to avoid being around people who are sick. This medicine may increase your risk to bruise or bleed. Call your doctor or health care professional if you notice any unusual bleeding. Be careful brushing and flossing your teeth or using a toothpick because you may get an infection or bleed more easily. If you have any dental work done, tell your dentist you are receiving this medicine. Avoid taking products that contain aspirin, acetaminophen, ibuprofen, naproxen, or ketoprofen unless instructed by your doctor. These medicines may hide a fever. Do not become pregnant while taking this medicine. Women should inform their doctor if they wish to become pregnant or think they might be pregnant. There is a potential for serious side effects to an unborn child. Talk to your health care professional or pharmacist for more information. Do not breast-feed an infant while taking this medicine. Men are advised not to father a child while receiving this medicine. This product may contain alcohol. Ask your pharmacist or healthcare provider if this medicine contains alcohol. Be sure to tell all healthcare providers you are  taking this medicine. Certain medicines, like metronidazole and disulfiram, can cause an unpleasant reaction when taken with alcohol. The reaction includes flushing, headache, nausea, vomiting, sweating, and increased thirst. The reaction can last from 30 minutes to several hours. What side effects may I notice from receiving this medicine? Side effects that you should report to your doctor or health care professional as soon as possible: -allergic reactions like skin rash, itching or hives, swelling of the face, lips, or tongue -low blood counts - This drug may decrease the number of white blood cells, red blood cells and platelets. You may be at increased risk for infections and bleeding. -signs of infection - fever or chills, cough, sore throat, pain or difficulty passing urine -signs of decreased platelets or bleeding - bruising, pinpoint red spots on the skin, black, tarry stools, nosebleeds -signs of decreased red blood cells - unusually weak or tired, fainting spells, lightheadedness -breathing problems -chest pain -high or low blood pressure -mouth sores -nausea and vomiting -pain, swelling, redness or irritation at the injection site -pain, tingling, numbness in the hands or feet -slow or irregular heartbeat -swelling of the ankle, feet, hands Side effects that usually do not require medical attention (report to your doctor or health care professional if they continue or are bothersome): -bone pain -complete hair loss including hair on your head, underarms, pubic hair, eyebrows, and eyelashes -changes in the color of fingernails -diarrhea -loosening of the fingernails -loss of appetite -muscle or joint pain -red flush to skin -sweating This list may not describe all possible side effects. Call your doctor for medical advice  about side effects. You may report side effects to FDA at 1-800-FDA-1088. Where should I keep my medicine? This drug is given in a hospital or clinic and  will not be stored at home. NOTE: This sheet is a summary. It may not cover all possible information. If you have questions about this medicine, talk to your doctor, pharmacist, or health care provider.  2018 Elsevier/Gold Standard (2015-06-17 19:58:00)  Carboplatin injection What is this medicine? CARBOPLATIN (KAR boe pla tin) is a chemotherapy drug. It targets fast dividing cells, like cancer cells, and causes these cells to die. This medicine is used to treat ovarian cancer and many other cancers. This medicine may be used for other purposes; ask your health care provider or pharmacist if you have questions. COMMON BRAND NAME(S): Paraplatin What should I tell my health care provider before I take this medicine? They need to know if you have any of these conditions: -blood disorders -hearing problems -kidney disease -recent or ongoing radiation therapy -an unusual or allergic reaction to carboplatin, cisplatin, other chemotherapy, other medicines, foods, dyes, or preservatives -pregnant or trying to get pregnant -breast-feeding How should I use this medicine? This drug is usually given as an infusion into a vein. It is administered in a hospital or clinic by a specially trained health care professional. Talk to your pediatrician regarding the use of this medicine in children. Special care may be needed. Overdosage: If you think you have taken too much of this medicine contact a poison control center or emergency room at once. NOTE: This medicine is only for you. Do not share this medicine with others. What if I miss a dose? It is important not to miss a dose. Call your doctor or health care professional if you are unable to keep an appointment. What may interact with this medicine? -medicines for seizures -medicines to increase blood counts like filgrastim, pegfilgrastim, sargramostim -some antibiotics like amikacin, gentamicin, neomycin, streptomycin, tobramycin -vaccines Talk to  your doctor or health care professional before taking any of these medicines: -acetaminophen -aspirin -ibuprofen -ketoprofen -naproxen This list may not describe all possible interactions. Give your health care provider a list of all the medicines, herbs, non-prescription drugs, or dietary supplements you use. Also tell them if you smoke, drink alcohol, or use illegal drugs. Some items may interact with your medicine. What should I watch for while using this medicine? Your condition will be monitored carefully while you are receiving this medicine. You will need important blood work done while you are taking this medicine. This drug may make you feel generally unwell. This is not uncommon, as chemotherapy can affect healthy cells as well as cancer cells. Report any side effects. Continue your course of treatment even though you feel ill unless your doctor tells you to stop. In some cases, you may be given additional medicines to help with side effects. Follow all directions for their use. Call your doctor or health care professional for advice if you get a fever, chills or sore throat, or other symptoms of a cold or flu. Do not treat yourself. This drug decreases your body's ability to fight infections. Try to avoid being around people who are sick. This medicine may increase your risk to bruise or bleed. Call your doctor or health care professional if you notice any unusual bleeding. Be careful brushing and flossing your teeth or using a toothpick because you may get an infection or bleed more easily. If you have any dental work done, tell your dentist  you are receiving this medicine. Avoid taking products that contain aspirin, acetaminophen, ibuprofen, naproxen, or ketoprofen unless instructed by your doctor. These medicines may hide a fever. Do not become pregnant while taking this medicine. Women should inform their doctor if they wish to become pregnant or think they might be pregnant. There is a  potential for serious side effects to an unborn child. Talk to your health care professional or pharmacist for more information. Do not breast-feed an infant while taking this medicine. What side effects may I notice from receiving this medicine? Side effects that you should report to your doctor or health care professional as soon as possible: -allergic reactions like skin rash, itching or hives, swelling of the face, lips, or tongue -signs of infection - fever or chills, cough, sore throat, pain or difficulty passing urine -signs of decreased platelets or bleeding - bruising, pinpoint red spots on the skin, black, tarry stools, nosebleeds -signs of decreased red blood cells - unusually weak or tired, fainting spells, lightheadedness -breathing problems -changes in hearing -changes in vision -chest pain -high blood pressure -low blood counts - This drug may decrease the number of white blood cells, red blood cells and platelets. You may be at increased risk for infections and bleeding. -nausea and vomiting -pain, swelling, redness or irritation at the injection site -pain, tingling, numbness in the hands or feet -problems with balance, talking, walking -trouble passing urine or change in the amount of urine Side effects that usually do not require medical attention (report to your doctor or health care professional if they continue or are bothersome): -hair loss -loss of appetite -metallic taste in the mouth or changes in taste This list may not describe all possible side effects. Call your doctor for medical advice about side effects. You may report side effects to FDA at 1-800-FDA-1088. Where should I keep my medicine? This drug is given in a hospital or clinic and will not be stored at home. NOTE: This sheet is a summary. It may not cover all possible information. If you have questions about this medicine, talk to your doctor, pharmacist, or health care provider.  2018 Elsevier/Gold  Standard (2007-11-21 14:38:05)

## 2017-05-23 NOTE — Progress Notes (Signed)
Oncology Nurse Navigator Documentation  Oncology Nurse Navigator Flowsheets 05/23/2017  Navigator Location [facility]  Navigator Encounter Type Other/PDL 1 test results completed. Dr. Julien Nordmann updated   Treatment Phase Treatment  Barriers/Navigation Needs Coordination of Care  Interventions Coordination of Care  Coordination of Care Other  Acuity Level 1  Time Spent with Patient 15

## 2017-05-24 ENCOUNTER — Ambulatory Visit
Admission: RE | Admit: 2017-05-24 | Discharge: 2017-05-24 | Disposition: A | Discharge status: Home / Self Care | Payer: Medicaid Other | Source: Ambulatory Visit | Attending: Radiation Oncology | Admitting: Radiation Oncology

## 2017-05-24 ENCOUNTER — Ambulatory Visit: Payer: Medicaid Other | Admitting: Radiation Oncology

## 2017-05-24 DIAGNOSIS — Z510 Encounter for antineoplastic radiation therapy: Secondary | ICD-10-CM | POA: Diagnosis not present

## 2017-05-24 DIAGNOSIS — C3492 Malignant neoplasm of unspecified part of left bronchus or lung: Secondary | ICD-10-CM

## 2017-05-24 MED ORDER — SONAFINE EX EMUL
1.0000 | Freq: Once | CUTANEOUS | Status: AC
Start: 2017-05-24 — End: 2017-05-24
  Administered 2017-05-24: 1 via TOPICAL

## 2017-05-24 NOTE — Progress Notes (Signed)
Pt here for patient teaching.  Pt given Radiation and You booklet and Sonafine. Reviewed areas of pertinence such as fatigue, skin changes, throat changes and cough . Pt able to give teach back of to pat skin and use unscented/gentle soap,apply Sonafine bid and avoid applying anything to skin within 4 hours of treatment. Pt demonstrated understanding and verbalizes understanding of information given and will contact nursing with any questions or concerns.          

## 2017-05-25 ENCOUNTER — Encounter: Payer: Self-pay | Admitting: Internal Medicine

## 2017-05-25 ENCOUNTER — Telehealth: Payer: Self-pay | Admitting: Medical Oncology

## 2017-05-25 ENCOUNTER — Ambulatory Visit
Admission: RE | Admit: 2017-05-25 | Discharge: 2017-05-25 | Disposition: A | Discharge status: Home / Self Care | Payer: Medicaid Other | Source: Ambulatory Visit | Attending: Radiation Oncology | Admitting: Radiation Oncology

## 2017-05-25 DIAGNOSIS — Z510 Encounter for antineoplastic radiation therapy: Secondary | ICD-10-CM | POA: Diagnosis not present

## 2017-05-25 NOTE — Telephone Encounter (Signed)
Chemo followup call - no answer after 10 rings , no voice mail

## 2017-05-25 NOTE — Progress Notes (Signed)
Received referral from Aleen Sells at [facility] regarding uninsured patient.  Reached out to Financial Specialist(Cindy) down in [place] to inquire if patient applied for Medicaid or not.  Received this email:  Raven Cochran has applied for Medicaid.  She applied 05-16-2017 in [facility]. The worker is Oliver Barre @ [phone].  We did not help her with this she went on her own.

## 2017-05-26 ENCOUNTER — Ambulatory Visit: Payer: Medicaid Other

## 2017-05-26 ENCOUNTER — Ambulatory Visit
Admission: RE | Admit: 2017-05-26 | Discharge: 2017-05-26 | Disposition: A | Discharge status: Home / Self Care | Payer: Medicaid Other | Source: Ambulatory Visit | Attending: Radiation Oncology | Admitting: Radiation Oncology

## 2017-05-26 DIAGNOSIS — Z510 Encounter for antineoplastic radiation therapy: Secondary | ICD-10-CM | POA: Diagnosis not present

## 2017-05-27 ENCOUNTER — Ambulatory Visit
Admission: RE | Admit: 2017-05-27 | Discharge: 2017-05-27 | Disposition: A | Discharge status: Home / Self Care | Payer: Medicaid Other | Source: Ambulatory Visit | Attending: Radiation Oncology | Admitting: Radiation Oncology

## 2017-05-27 ENCOUNTER — Ambulatory Visit: Payer: Medicaid Other

## 2017-05-27 DIAGNOSIS — Z510 Encounter for antineoplastic radiation therapy: Secondary | ICD-10-CM | POA: Diagnosis not present

## 2017-05-30 ENCOUNTER — Encounter: Payer: Self-pay | Admitting: Oncology

## 2017-05-30 ENCOUNTER — Ambulatory Visit
Admission: RE | Admit: 2017-05-30 | Discharge: 2017-05-30 | Disposition: A | Discharge status: Home / Self Care | Payer: Medicaid Other | Source: Ambulatory Visit | Attending: Radiation Oncology | Admitting: Radiation Oncology

## 2017-05-30 ENCOUNTER — Ambulatory Visit: Payer: Self-pay | Admitting: Nutrition

## 2017-05-30 ENCOUNTER — Ambulatory Visit (HOSPITAL_BASED_OUTPATIENT_CLINIC_OR_DEPARTMENT_OTHER): Payer: Medicaid Other | Admitting: Oncology

## 2017-05-30 ENCOUNTER — Other Ambulatory Visit (HOSPITAL_BASED_OUTPATIENT_CLINIC_OR_DEPARTMENT_OTHER): Payer: Medicaid Other

## 2017-05-30 ENCOUNTER — Ambulatory Visit (HOSPITAL_BASED_OUTPATIENT_CLINIC_OR_DEPARTMENT_OTHER): Payer: Medicaid Other

## 2017-05-30 ENCOUNTER — Ambulatory Visit: Payer: Self-pay

## 2017-05-30 VITALS — BP 136/88 | HR 98 | Temp 97.8°F | Resp 18 | Ht 64.0 in | Wt 158.3 lb

## 2017-05-30 DIAGNOSIS — Z5111 Encounter for antineoplastic chemotherapy: Secondary | ICD-10-CM | POA: Diagnosis not present

## 2017-05-30 DIAGNOSIS — C3492 Malignant neoplasm of unspecified part of left bronchus or lung: Secondary | ICD-10-CM

## 2017-05-30 DIAGNOSIS — C3412 Malignant neoplasm of upper lobe, left bronchus or lung: Secondary | ICD-10-CM

## 2017-05-30 DIAGNOSIS — Z95828 Presence of other vascular implants and grafts: Secondary | ICD-10-CM

## 2017-05-30 DIAGNOSIS — C3490 Malignant neoplasm of unspecified part of unspecified bronchus or lung: Secondary | ICD-10-CM

## 2017-05-30 DIAGNOSIS — Z510 Encounter for antineoplastic radiation therapy: Secondary | ICD-10-CM | POA: Diagnosis not present

## 2017-05-30 DIAGNOSIS — Z7189 Other specified counseling: Secondary | ICD-10-CM

## 2017-05-30 LAB — CBC WITH DIFFERENTIAL/PLATELET
BASO%: 0.5 % (ref 0.0–2.0)
Basophils Absolute: 0 10*3/uL (ref 0.0–0.1)
EOS%: 1.5 % (ref 0.0–7.0)
Eosinophils Absolute: 0.1 10*3/uL (ref 0.0–0.5)
HCT: 33 % — ABNORMAL LOW (ref 34.8–46.6)
HGB: 11.5 g/dL — ABNORMAL LOW (ref 11.6–15.9)
LYMPH#: 1 10*3/uL (ref 0.9–3.3)
LYMPH%: 13.3 % — ABNORMAL LOW (ref 14.0–49.7)
MCH: 31.3 pg (ref 25.1–34.0)
MCHC: 34.9 g/dL (ref 31.5–36.0)
MCV: 89.7 fL (ref 79.5–101.0)
MONO#: 0.7 10*3/uL (ref 0.1–0.9)
MONO%: 9 % (ref 0.0–14.0)
NEUT#: 5.9 10*3/uL (ref 1.5–6.5)
NEUT%: 75.7 % (ref 38.4–76.8)
Platelets: 414 10*3/uL — ABNORMAL HIGH (ref 145–400)
RBC: 3.68 10*6/uL — AB (ref 3.70–5.45)
RDW: 13.9 % (ref 11.2–14.5)
WBC: 7.8 10*3/uL (ref 3.9–10.3)

## 2017-05-30 LAB — COMPREHENSIVE METABOLIC PANEL
ALT: 27 U/L (ref 0–55)
AST: 20 U/L (ref 5–34)
Albumin: 2.7 g/dL — ABNORMAL LOW (ref 3.5–5.0)
Alkaline Phosphatase: 132 U/L (ref 40–150)
Anion Gap: 10 mEq/L (ref 3–11)
BUN: 6.3 mg/dL — ABNORMAL LOW (ref 7.0–26.0)
CHLORIDE: 95 meq/L — AB (ref 98–109)
CO2: 23 meq/L (ref 22–29)
Calcium: 9.4 mg/dL (ref 8.4–10.4)
Creatinine: 0.6 mg/dL (ref 0.6–1.1)
GLUCOSE: 119 mg/dL (ref 70–140)
POTASSIUM: 4.1 meq/L (ref 3.5–5.1)
SODIUM: 128 meq/L — AB (ref 136–145)
Total Bilirubin: 0.41 mg/dL (ref 0.20–1.20)
Total Protein: 6.4 g/dL (ref 6.4–8.3)

## 2017-05-30 MED ORDER — PALONOSETRON HCL INJECTION 0.25 MG/5ML
0.2500 mg | Freq: Once | INTRAVENOUS | Status: AC
  Administered 2017-05-30: 0.25 mg via INTRAVENOUS

## 2017-05-30 MED ORDER — FAMOTIDINE IN NACL 20-0.9 MG/50ML-% IV SOLN
20.0000 mg | Freq: Once | INTRAVENOUS | Status: AC
  Administered 2017-05-30: 20 mg via INTRAVENOUS

## 2017-05-30 MED ORDER — SODIUM CHLORIDE 0.9 % IV SOLN
Freq: Once | INTRAVENOUS | Status: AC
  Administered 2017-05-30: 11:00:00 via INTRAVENOUS

## 2017-05-30 MED ORDER — DEXTROSE 5 % IV SOLN
45.0000 mg/m2 | Freq: Once | INTRAVENOUS | Status: AC
  Administered 2017-05-30: 78 mg via INTRAVENOUS
  Filled 2017-05-30: qty 13

## 2017-05-30 MED ORDER — HEPARIN SOD (PORK) LOCK FLUSH 100 UNIT/ML IV SOLN
500.0000 [IU] | Freq: Once | INTRAVENOUS | Status: AC | PRN
  Administered 2017-05-30: 500 [IU]
  Filled 2017-05-30: qty 5

## 2017-05-30 MED ORDER — SODIUM CHLORIDE 0.9 % IV SOLN
20.0000 mg | Freq: Once | INTRAVENOUS | Status: AC
  Administered 2017-05-30: 20 mg via INTRAVENOUS
  Filled 2017-05-30: qty 2

## 2017-05-30 MED ORDER — FAMOTIDINE IN NACL 20-0.9 MG/50ML-% IV SOLN
INTRAVENOUS | Status: AC
  Filled 2017-05-30: qty 50

## 2017-05-30 MED ORDER — PALONOSETRON HCL INJECTION 0.25 MG/5ML
INTRAVENOUS | Status: AC
  Filled 2017-05-30: qty 5

## 2017-05-30 MED ORDER — DIPHENHYDRAMINE HCL 50 MG/ML IJ SOLN
INTRAMUSCULAR | Status: AC
  Filled 2017-05-30: qty 1

## 2017-05-30 MED ORDER — SODIUM CHLORIDE 0.9 % IV SOLN
243.4000 mg | Freq: Once | INTRAVENOUS | Status: AC
  Administered 2017-05-30: 240 mg via INTRAVENOUS
  Filled 2017-05-30: qty 24

## 2017-05-30 MED ORDER — SODIUM CHLORIDE 0.9% FLUSH
10.0000 mL | INTRAVENOUS | Status: DC | PRN
  Administered 2017-05-30: 10 mL
  Filled 2017-05-30: qty 10

## 2017-05-30 MED ORDER — SODIUM CHLORIDE 0.9% FLUSH
10.0000 mL | INTRAVENOUS | Status: DC | PRN
  Administered 2017-05-30: 10 mL via INTRAVENOUS
  Filled 2017-05-30: qty 10

## 2017-05-30 MED ORDER — DIPHENHYDRAMINE HCL 50 MG/ML IJ SOLN
50.0000 mg | Freq: Once | INTRAMUSCULAR | Status: AC
  Administered 2017-05-30: 50 mg via INTRAVENOUS

## 2017-05-30 NOTE — Progress Notes (Signed)
Patient was identified to be at risk for malnutrition on the MST secondary to weight loss and poor appetite.  Patient is a 46 year old female diagnosed with non-small cell lung cancer.  She is a patient of Dr. Julien Nordmann and Dr. Sondra Come receiving concurrent chemoradiation therapy.  Past medical history includes tobacco, and migraines.  Medications include Compazine.  Labs include sodium 131, glucose 69, albumin 2.9.  Height: 64 inches. Weight: 158.3 pounds October 1. Usual body weight: 168 pounds September 6. BMI: 27.17.  Patient does endorse poor appetite. She can eat very small amounts but if she pushes herself, she vomits. She reports her nausea medication has been helpful. Physical exam did not reveal fat loss, muscle loss or edema. Weight has been stable the past 2 weeks.  Nutrition diagnosis: Unintended weight loss related to new diagnosis of lung cancer as evidenced by no prior need for nutrition related information.  Intervention: Patient was educated to increase calories and protein in small frequent meals and snacks. Recommended patient try some type of oral nutrition supplement for additional calories and protein. Specific oral nutrition supplements were discussed.  Monitoring, evaluation, goals: Patient will work to increase calories and protein to minimize weight loss.  Next visit: Patient will contact me for questions or concerns.  **Disclaimer: This note was dictated with voice recognition software. Similar sounding words can inadvertently be transcribed and this note may contain transcription errors which may not have been corrected upon publication of note.**

## 2017-05-30 NOTE — Assessment & Plan Note (Signed)
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 PDL 1 is 20%.  Discussed MRI of the brain results which not show any evidence of metastasis. The patient is currently on a course of concurrent chemoradiation with weekly carboplatin for AUC of 2 and paclitaxel 45 MG/M2. Status post 1 cycle which she tolerated well overall. Recommend that she continues her Compazine as needed for nausea. Recommend that she proceed with cycle 2 of her chemotherapy today as scheduled.  The patient will return in one week for labs and chemotherapy in 2 weeks she will have a return visit for evaluation prior to cycle 4 of her chemotherapy.  The patient was advised to call immediately if she has any concerning symptoms in the interval. The patient voices understanding of current disease status and treatment options and is in agreement with the current care plan.  All questions were answered. The patient knows to call the clinic with any problems, questions or concerns. We can certainly see the patient much sooner if necessary.

## 2017-05-30 NOTE — Patient Instructions (Signed)
   [facility] Discharge Instructions for Patients Receiving Chemotherapy  Today you received the following chemotherapy agents Taxol and Carboplatin   To help prevent nausea and vomiting after your treatment, we encourage you to take your nausea medication as directed.    If you develop nausea and vomiting that is not controlled by your nausea medication, call the clinic.   BELOW ARE SYMPTOMS THAT SHOULD BE REPORTED IMMEDIATELY:  *FEVER GREATER THAN 100.5 F  *CHILLS WITH OR WITHOUT FEVER  NAUSEA AND VOMITING THAT IS NOT CONTROLLED WITH YOUR NAUSEA MEDICATION  *UNUSUAL SHORTNESS OF BREATH  *UNUSUAL BRUISING OR BLEEDING  TENDERNESS IN MOUTH AND THROAT WITH OR WITHOUT PRESENCE OF ULCERS  *URINARY PROBLEMS  *BOWEL PROBLEMS  UNUSUAL RASH Items with * indicate a potential emergency and should be followed up as soon as possible.  Feel free to call the clinic should you have any questions or concerns. The clinic phone number is [phone].  Please show the CHEMO ALERT CARD at check-in to the Emergency Department and triage nurse.   

## 2017-05-30 NOTE — Progress Notes (Signed)
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 First dose given on 05/23/2017.  INTERVAL HISTORY: Raven Cochran 46 y.o. female returns for routine follow-up with her fianc. Patient is feeling fine today and has no specific complaints except for fatigue. Denies fevers and chills. Denies chest pain, shortness of breath, hemoptysis. She reports a cough and uses Delsym for this. She has had intermittent nausea and uses her Compazine. No vomiting today to patient diarrhea. The patient is here for evaluation prior to cycle 2 of her chemotherapy and to also review her recent MRI of the brain results.   Patient Active Problem List   Diagnosis Date Noted  . Stage III squamous cell carcinoma of left lung ([name]) 05/12/2017  . Encounter for antineoplastic chemotherapy 05/12/2017  . Goals of care, counseling/discussion 05/12/2017  . Mediastinal lymphadenopathy   . Lung mass 04/15/2017  . Chronic cough 04/15/2017  . Menopausal hot flushes 01/31/2017    has No Known Allergies.  MEDICAL HISTORY: Past Medical History:  Diagnosis Date  . Encounter for antineoplastic chemotherapy 05/12/2017  . Headache    hx migraines none in 10 yrs  . Lung mass    left hilary mass  . PONV (postoperative nausea and vomiting)   . UTI (urinary tract infection) 9 weeks ago    SURGICAL HISTORY: Past Surgical History:   Procedure Laterality Date  . BREAST SURGERY Bilateral 2000   augmentation  . CESAREAN SECTION  2006   x 1   . ENDOBRONCHIAL ULTRASOUND Bilateral 05/03/2017   Procedure: ENDOBRONCHIAL ULTRASOUND;  Surgeon: Rigoberto Noel, MD;  Location: [facility];  Service: Cardiopulmonary;  Laterality: Bilateral;  . IR FLUORO GUIDE PORT INSERTION RIGHT  05/19/2017  . IR US GUIDE VASC ACCESS RIGHT  05/19/2017    SOCIAL HISTORY: Social History   Social History  . Marital status: Divorced    Spouse name: N/A  . Number of children: N/A  . Years of education: N/A   Occupational History  . Not on file.   Social History Main Topics  . Smoking status: Current Every Day Smoker    Packs/day: 1.00    Years: 22.00    Types: Cigarettes  . Smokeless tobacco: Never Used  . Alcohol use Yes     Comment: 4 times week beer  . Drug use: No  . Sexual activity: Yes     Comment: mirena uid, boyfrined vasectomy   Other Topics Concern  . Not on file   Social History Narrative  . No narrative on file    FAMILY HISTORY: History reviewed. No pertinent family history.  Review of Systems  Constitutional: Positive for fatigue. Negative for chills and fever.  HENT:  Negative.   Eyes: Negative.   Respiratory: Positive for cough. Negative for hemoptysis and shortness of breath.   Cardiovascular: Negative.   Gastrointestinal: Negative.   Genitourinary: Negative.    Musculoskeletal:  Negative.   Skin: Negative.   Neurological: Negative.   Hematological: Negative.   Psychiatric/Behavioral: Negative.      PHYSICAL EXAMINATION  ECOG PERFORMANCE STATUS: 1 - Symptomatic but completely ambulatory  Vitals:   05/30/17 1038  BP: 136/88  Pulse: 98  Resp: 18  Temp: 97.8 F (36.6 C)  SpO2: 98%    Physical Exam  Constitutional: She is oriented to person, place, and time and well-developed, well-nourished, and in no distress. No distress.  HENT:  Head: Normocephalic and atraumatic.  Mouth/Throat:  Oropharynx is clear and moist. No oropharyngeal exudate.  Eyes: Conjunctivae are normal. Right eye exhibits no discharge. Left eye exhibits no discharge. No scleral icterus.  Neck: Normal range of motion. Neck supple.  Cardiovascular: Normal rate, regular rhythm, normal heart sounds and intact distal pulses.   Pulmonary/Chest: Effort normal and breath sounds normal. No respiratory distress. She has no wheezes. She has no rales.  Abdominal: Soft. Bowel sounds are normal. She exhibits no distension and no mass. There is no tenderness.  Musculoskeletal: Normal range of motion. She exhibits no edema.  Lymphadenopathy:    She has no cervical adenopathy.  Neurological: She is alert and oriented to person, place, and time. She exhibits normal muscle tone. Gait normal. Coordination normal.  Skin: Skin is warm and dry. No rash noted. She is not diaphoretic. No erythema. No pallor.  Psychiatric: Mood, memory, affect and judgment normal.  Vitals reviewed.   LABORATORY DATA:  CBC    Component Value Date/Time   WBC 7.8 05/30/2017 0946   WBC 10.1 05/19/2017 1029   RBC 3.68 (L) 05/30/2017 0946   RBC 4.22 05/19/2017 1029   HGB 11.5 (L) 05/30/2017 0946   HCT 33.0 (L) 05/30/2017 0946   PLT 414 (H) 05/30/2017 0946   MCV 89.7 05/30/2017 0946   MCH 31.3 05/30/2017 0946   MCH 30.3 05/19/2017 1029   MCHC 34.9 05/30/2017 0946   MCHC 34.7 05/19/2017 1029   RDW 13.9 05/30/2017 0946   LYMPHSABS 1.0 05/30/2017 0946   MONOABS 0.7 05/30/2017 0946   EOSABS 0.1 05/30/2017 0946   BASOSABS 0.0 05/30/2017 0946    CMP     Component Value Date/Time   NA 128 (L) 05/30/2017 0946   K 4.1 05/30/2017 0946   CL 97 (L) 05/19/2017 1029   CO2 23 05/30/2017 0946   GLUCOSE 119 05/30/2017 0946   BUN 6.3 (L) 05/30/2017 0946   CREATININE 0.6 05/30/2017 0946   CALCIUM 9.4 05/30/2017 0946   PROT 6.4 05/30/2017 0946   ALBUMIN 2.7 (L) 05/30/2017 0946   AST 20 05/30/2017 0946   ALT 27 05/30/2017 0946   ALKPHOS 132  05/30/2017 0946   BILITOT 0.41 05/30/2017 0946   GFRNONAA >60 05/19/2017 1029   GFRAA >60 05/19/2017 1029    RADIOGRAPHIC STUDIES:  Mr Jeri Cos Wo Contrast  Result Date: 05/20/2017 CLINICAL DATA:  Non-small-cell lung cancer. Staging. No neurologic complaints are reported. EXAM: MRI HEAD WITHOUT AND WITH CONTRAST TECHNIQUE: Multiplanar, multiecho pulse sequences of the brain and surrounding structures were obtained without and with intravenous contrast. CONTRAST:  72mL MULTIHANCE GADOBENATE DIMEGLUMINE 529 MG/ML IV SOLN COMPARISON:  PET scan 04/26/2017. FINDINGS: Brain: No evidence for acute infarction, hemorrhage, mass lesion, hydrocephalus, or extra-axial fluid. Normal cerebral volume. No white matter disease. Post infusion, no abnormal enhancement of the brain or meninges. Vascular: Normal flow voids.  Partial empty sella. Skull and upper cervical spine: Normal marrow signal. Sinuses/Orbits: Negative. Other: None. IMPRESSION: Negative cranial MRI. No  evidence for intracranial metastatic disease. Electronically Signed   By: Staci Righter M.D.   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 PDL 1 is 20%.  Discussed MRI of the brain results which not show any evidence of metastasis. The patient is currently on a course of concurrent chemoradiation with weekly carboplatin for AUC of 2 and paclitaxel 45 MG/M2. Status post 1 cycle which she tolerated well overall. Recommend that she continues her Compazine as needed for nausea. Recommend that she proceed with cycle 2 of her chemotherapy today as scheduled.  The patient will return in one week for labs and  chemotherapy in 2 weeks she will have a return visit for evaluation prior to cycle 4 of her chemotherapy.  The patient was advised to call immediately if she has any concerning symptoms in the interval. The patient voices understanding of current disease status and treatment options and is in agreement with the current care plan.  All questions were answered. The patient knows to call the clinic with any problems, questions or concerns. We can certainly see the patient much sooner if necessary.   No orders of the defined types were placed in this encounter.   All questions were answered. The patient knows to call the clinic with any problems, questions or concerns. We can certainly see the patient much sooner if necessary.  Mikey Bussing, NP 05/30/2017

## 2017-05-31 ENCOUNTER — Telehealth: Payer: Self-pay | Admitting: Oncology

## 2017-05-31 ENCOUNTER — Ambulatory Visit
Admission: RE | Admit: 2017-05-31 | Discharge: 2017-05-31 | Disposition: A | Discharge status: Home / Self Care | Payer: Medicaid Other | Source: Ambulatory Visit | Attending: Radiation Oncology | Admitting: Radiation Oncology

## 2017-05-31 ENCOUNTER — Other Ambulatory Visit: Payer: Self-pay | Admitting: Radiation Oncology

## 2017-05-31 DIAGNOSIS — Z510 Encounter for antineoplastic radiation therapy: Secondary | ICD-10-CM | POA: Diagnosis not present

## 2017-05-31 DIAGNOSIS — C3492 Malignant neoplasm of unspecified part of left bronchus or lung: Secondary | ICD-10-CM

## 2017-05-31 MED ORDER — ALPRAZOLAM 0.5 MG PO TABS: 0.5000 mg | ORAL_TABLET | Freq: Three times a day (TID) | ORAL | 0 refills | Status: DC | PRN

## 2017-05-31 NOTE — Telephone Encounter (Signed)
No 10/1 los

## 2017-06-01 ENCOUNTER — Ambulatory Visit
Admission: RE | Admit: 2017-06-01 | Discharge: 2017-06-01 | Disposition: A | Discharge status: Home / Self Care | Payer: Medicaid Other | Source: Ambulatory Visit | Attending: Radiation Oncology | Admitting: Radiation Oncology

## 2017-06-01 DIAGNOSIS — Z510 Encounter for antineoplastic radiation therapy: Secondary | ICD-10-CM | POA: Diagnosis not present

## 2017-06-02 ENCOUNTER — Ambulatory Visit
Admission: RE | Admit: 2017-06-02 | Discharge: 2017-06-02 | Disposition: A | Discharge status: Home / Self Care | Payer: Medicaid Other | Source: Ambulatory Visit | Attending: Radiation Oncology | Admitting: Radiation Oncology

## 2017-06-02 DIAGNOSIS — Z510 Encounter for antineoplastic radiation therapy: Secondary | ICD-10-CM | POA: Diagnosis not present

## 2017-06-03 ENCOUNTER — Ambulatory Visit
Admission: RE | Admit: 2017-06-03 | Discharge: 2017-06-03 | Disposition: A | Discharge status: Home / Self Care | Payer: Medicaid Other | Source: Ambulatory Visit | Attending: Radiation Oncology | Admitting: Radiation Oncology

## 2017-06-03 ENCOUNTER — Encounter: Payer: Self-pay | Admitting: Radiation Oncology

## 2017-06-03 DIAGNOSIS — Z510 Encounter for antineoplastic radiation therapy: Secondary | ICD-10-CM | POA: Diagnosis not present

## 2017-06-06 ENCOUNTER — Other Ambulatory Visit: Payer: Self-pay | Admitting: Medical Oncology

## 2017-06-06 ENCOUNTER — Ambulatory Visit
Admission: RE | Admit: 2017-06-06 | Discharge: 2017-06-06 | Disposition: A | Discharge status: Home / Self Care | Payer: Medicaid Other | Source: Ambulatory Visit | Attending: Radiation Oncology | Admitting: Radiation Oncology

## 2017-06-06 ENCOUNTER — Other Ambulatory Visit (HOSPITAL_BASED_OUTPATIENT_CLINIC_OR_DEPARTMENT_OTHER): Payer: Medicaid Other

## 2017-06-06 ENCOUNTER — Other Ambulatory Visit: Payer: Self-pay | Admitting: *Deleted

## 2017-06-06 ENCOUNTER — Ambulatory Visit (HOSPITAL_BASED_OUTPATIENT_CLINIC_OR_DEPARTMENT_OTHER): Payer: Medicaid Other

## 2017-06-06 VITALS — BP 147/90 | HR 95 | Temp 97.7°F | Resp 18

## 2017-06-06 DIAGNOSIS — Z23 Encounter for immunization: Secondary | ICD-10-CM

## 2017-06-06 DIAGNOSIS — C3412 Malignant neoplasm of upper lobe, left bronchus or lung: Secondary | ICD-10-CM

## 2017-06-06 DIAGNOSIS — Z5111 Encounter for antineoplastic chemotherapy: Secondary | ICD-10-CM

## 2017-06-06 DIAGNOSIS — C3492 Malignant neoplasm of unspecified part of left bronchus or lung: Secondary | ICD-10-CM

## 2017-06-06 DIAGNOSIS — Z510 Encounter for antineoplastic radiation therapy: Secondary | ICD-10-CM | POA: Diagnosis not present

## 2017-06-06 DIAGNOSIS — Z7189 Other specified counseling: Secondary | ICD-10-CM

## 2017-06-06 DIAGNOSIS — C3490 Malignant neoplasm of unspecified part of unspecified bronchus or lung: Secondary | ICD-10-CM

## 2017-06-06 LAB — COMPREHENSIVE METABOLIC PANEL
ALK PHOS: 175 U/L — AB (ref 40–150)
ALT: 19 U/L (ref 0–55)
AST: 15 U/L (ref 5–34)
Albumin: 2.8 g/dL — ABNORMAL LOW (ref 3.5–5.0)
Anion Gap: 8 mEq/L (ref 3–11)
BUN: 5.5 mg/dL — AB (ref 7.0–26.0)
CHLORIDE: 100 meq/L (ref 98–109)
CO2: 25 mEq/L (ref 22–29)
CREATININE: 0.6 mg/dL (ref 0.6–1.1)
Calcium: 9.3 mg/dL (ref 8.4–10.4)
EGFR: >90 mL/min/{1.73_m2} (ref >90)
GLUCOSE: 97 mg/dL (ref 70–140)
Potassium: 4 mEq/L (ref 3.5–5.1)
Sodium: 133 mEq/L — ABNORMAL LOW (ref 136–145)
TOTAL PROTEIN: 6.5 g/dL (ref 6.4–8.3)
Total Bilirubin: 0.33 mg/dL (ref 0.20–1.20)

## 2017-06-06 LAB — CBC WITH DIFFERENTIAL/PLATELET
BASO%: 0.2 % (ref 0.0–2.0)
BASOS ABS: 0 10*3/uL (ref 0.0–0.1)
EOS ABS: 0.1 10*3/uL (ref 0.0–0.5)
EOS%: 1.7 % (ref 0.0–7.0)
HEMATOCRIT: 32.6 % — AB (ref 34.8–46.6)
HGB: 10.9 g/dL — ABNORMAL LOW (ref 11.6–15.9)
LYMPH#: 0.7 10*3/uL — AB (ref 0.9–3.3)
LYMPH%: 13.5 % — ABNORMAL LOW (ref 14.0–49.7)
MCH: 29.7 pg (ref 25.1–34.0)
MCHC: 33.4 g/dL (ref 31.5–36.0)
MCV: 88.8 fL (ref 79.5–101.0)
MONO#: 0.5 10*3/uL (ref 0.1–0.9)
MONO%: 9.9 % (ref 0.0–14.0)
NEUT#: 3.9 10*3/uL (ref 1.5–6.5)
NEUT%: 74.7 % (ref 38.4–76.8)
PLATELETS: 368 10*3/uL (ref 145–400)
RBC: 3.67 10*6/uL — ABNORMAL LOW (ref 3.70–5.45)
RDW: 13.7 % (ref 11.2–14.5)
WBC: 5.2 10*3/uL (ref 3.9–10.3)

## 2017-06-06 MED ORDER — PALONOSETRON HCL INJECTION 0.25 MG/5ML
INTRAVENOUS | Status: AC
  Filled 2017-06-06: qty 5

## 2017-06-06 MED ORDER — PALONOSETRON HCL INJECTION 0.25 MG/5ML
0.2500 mg | Freq: Once | INTRAVENOUS | Status: AC
  Administered 2017-06-06: 0.25 mg via INTRAVENOUS

## 2017-06-06 MED ORDER — PROCHLORPERAZINE MALEATE 10 MG PO TABS: 10.0000 mg | ORAL_TABLET | Freq: Four times a day (QID) | ORAL | 1 refills | Status: AC | PRN

## 2017-06-06 MED ORDER — FAMOTIDINE IN NACL 20-0.9 MG/50ML-% IV SOLN
20.0000 mg | Freq: Once | INTRAVENOUS | Status: AC
  Administered 2017-06-06: 20 mg via INTRAVENOUS

## 2017-06-06 MED ORDER — SODIUM CHLORIDE 0.9 % IV SOLN
243.4000 mg | Freq: Once | INTRAVENOUS | Status: AC
  Administered 2017-06-06: 240 mg via INTRAVENOUS
  Filled 2017-06-06: qty 24

## 2017-06-06 MED ORDER — FAMOTIDINE IN NACL 20-0.9 MG/50ML-% IV SOLN
INTRAVENOUS | Status: AC
  Filled 2017-06-06: qty 50

## 2017-06-06 MED ORDER — PACLITAXEL CHEMO INJECTION 300 MG/50ML
45.0000 mg/m2 | Freq: Once | INTRAVENOUS | Status: AC
  Administered 2017-06-06: 78 mg via INTRAVENOUS
  Filled 2017-06-06: qty 13

## 2017-06-06 MED ORDER — HEPARIN SOD (PORK) LOCK FLUSH 100 UNIT/ML IV SOLN
500.0000 [IU] | Freq: Once | INTRAVENOUS | Status: AC | PRN
  Administered 2017-06-06: 500 [IU]
  Filled 2017-06-06: qty 5

## 2017-06-06 MED ORDER — SODIUM CHLORIDE 0.9 % IV SOLN
20.0000 mg | Freq: Once | INTRAVENOUS | Status: AC
  Administered 2017-06-06: 20 mg via INTRAVENOUS
  Filled 2017-06-06: qty 2

## 2017-06-06 MED ORDER — SODIUM CHLORIDE 0.9% FLUSH
10.0000 mL | INTRAVENOUS | Status: DC | PRN
  Administered 2017-06-06: 10 mL
  Filled 2017-06-06: qty 10

## 2017-06-06 MED ORDER — SODIUM CHLORIDE 0.9 % IV SOLN
Freq: Once | INTRAVENOUS | Status: AC
  Administered 2017-06-06: 11:00:00 via INTRAVENOUS

## 2017-06-06 MED ORDER — INFLUENZA VAC SPLIT QUAD 0.5 ML IM SUSY
0.5000 mL | PREFILLED_SYRINGE | Freq: Once | INTRAMUSCULAR | Status: AC
  Administered 2017-06-06: 0.5 mL via INTRAMUSCULAR
  Filled 2017-06-06: qty 0.5

## 2017-06-06 MED ORDER — DIPHENHYDRAMINE HCL 50 MG/ML IJ SOLN
INTRAMUSCULAR | Status: AC
  Filled 2017-06-06: qty 1

## 2017-06-06 MED ORDER — DIPHENHYDRAMINE HCL 50 MG/ML IJ SOLN
50.0000 mg | Freq: Once | INTRAMUSCULAR | Status: AC
  Administered 2017-06-06: 50 mg via INTRAVENOUS

## 2017-06-06 NOTE — Progress Notes (Signed)
  Radiation Oncology         [phone] ________________________________  Name: Raven Cochran MRN: [id number]  Date: 06/06/2017  DOB: December 09, 1970  SIMULATION AND TREATMENT PLANNING NOTE    ICD-10-CM   1. 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 This warranted re- simulation The patient was brought to the [facility].  Identity was confirmed.  All relevant records and images related to the planned course of therapy were reviewed.  The patient freely provided informed written consent to proceed with treatment after reviewing the details related to the planned course of therapy. The consent form was witnessed and verified by the simulation staff.  Then, the patient was set-up in a stable reproducible  supine position for radiation therapy.  CT images were obtained.  Surface markings were placed.  The CT images were loaded into the planning software.  Then the target and avoidance structures were contoured.  Treatment planning then occurred.  The radiation prescription was entered and confirmed.  Then, I designed and supervised the construction of a total of 5 medically necessary complex treatment devices.  I have requested : 3D Simulation  I have requested a DVH of the following structures: Heart, lungs, spinal cord, esophagus, planning target volume, gross target volume.  I have ordered:dose calc.  PLAN:  The patient will receive a total dose 60 Gy in 30 fractions along with radiosensitizing chemotherapy. She will start her new field  as soon his planning is complete.  -----------------------------------  Blair Promise, PhD, MD  This document serves as a record of services personally performed by Gery Pray, MD. It was created on his behalf by Arlyce Harman, a trained medical scribe. The creation of this record is based on the scribe's personal observations and the provider's statements to them. This document has been checked and approved by the attending provider.

## 2017-06-06 NOTE — Progress Notes (Signed)
Pt had a lot of coughing while here but states that she is also coughing at home.  Not productive.  Informed Lucille Passy RN & pt is supposed to be taking delsym every 12 hours but states that she is taking a little bit at hs.  Instructed to increase to every 12 hours & if not better to call.  She expressed understanding.

## 2017-06-06 NOTE — Patient Instructions (Signed)
[facility] Discharge Instructions for Patients Receiving Chemotherapy  Today you received the following chemotherapy agents:  Taxol & Carboplatin  To help prevent nausea and vomiting after your treatment, we encourage you to take your nausea medication.   If you develop nausea and vomiting that is not controlled by your nausea medication, call the clinic.   BELOW ARE SYMPTOMS THAT SHOULD BE REPORTED IMMEDIATELY:  *FEVER GREATER THAN 100.5 F  *CHILLS WITH OR WITHOUT FEVER  NAUSEA AND VOMITING THAT IS NOT CONTROLLED WITH YOUR NAUSEA MEDICATION  *UNUSUAL SHORTNESS OF BREATH  *UNUSUAL BRUISING OR BLEEDING  TENDERNESS IN MOUTH AND THROAT WITH OR WITHOUT PRESENCE OF ULCERS  *URINARY PROBLEMS  *BOWEL PROBLEMS  UNUSUAL RASH Items with * indicate a potential emergency and should be followed up as soon as possible.  Feel free to call the clinic should you have any questions or concerns. The clinic phone number is [phone].  Please show the [facility] at check-in to the Emergency Department and triage nurse.

## 2017-06-07 ENCOUNTER — Ambulatory Visit
Admission: RE | Admit: 2017-06-07 | Discharge: 2017-06-07 | Disposition: A | Discharge status: Home / Self Care | Payer: Medicaid Other | Source: Ambulatory Visit | Attending: Radiation Oncology | Admitting: Radiation Oncology

## 2017-06-07 DIAGNOSIS — Z510 Encounter for antineoplastic radiation therapy: Secondary | ICD-10-CM | POA: Diagnosis not present

## 2017-06-08 ENCOUNTER — Ambulatory Visit
Admission: RE | Admit: 2017-06-08 | Discharge: 2017-06-08 | Disposition: A | Discharge status: Home / Self Care | Payer: Medicaid Other | Source: Ambulatory Visit | Attending: Radiation Oncology | Admitting: Radiation Oncology

## 2017-06-08 DIAGNOSIS — Z510 Encounter for antineoplastic radiation therapy: Secondary | ICD-10-CM | POA: Diagnosis not present

## 2017-06-09 ENCOUNTER — Ambulatory Visit
Admission: RE | Admit: 2017-06-09 | Discharge: 2017-06-09 | Disposition: A | Discharge status: Home / Self Care | Payer: Medicaid Other | Source: Ambulatory Visit | Attending: Radiation Oncology | Admitting: Radiation Oncology

## 2017-06-09 DIAGNOSIS — Z510 Encounter for antineoplastic radiation therapy: Secondary | ICD-10-CM | POA: Diagnosis not present

## 2017-06-10 ENCOUNTER — Telehealth: Payer: Self-pay | Admitting: Oncology

## 2017-06-10 ENCOUNTER — Ambulatory Visit
Admission: RE | Admit: 2017-06-10 | Discharge: 2017-06-10 | Disposition: A | Discharge status: Home / Self Care | Payer: Medicaid Other | Source: Ambulatory Visit | Attending: Radiation Oncology | Admitting: Radiation Oncology

## 2017-06-10 DIAGNOSIS — Z510 Encounter for antineoplastic radiation therapy: Secondary | ICD-10-CM | POA: Diagnosis not present

## 2017-06-10 NOTE — Telephone Encounter (Signed)
Left a message for patient regarding her loosing her voice.  Asked for a return call to discuss symptoms.

## 2017-06-13 ENCOUNTER — Telehealth: Payer: Self-pay | Admitting: Internal Medicine

## 2017-06-13 ENCOUNTER — Ambulatory Visit: Payer: Self-pay

## 2017-06-13 ENCOUNTER — Ambulatory Visit
Admission: RE | Admit: 2017-06-13 | Discharge: 2017-06-13 | Disposition: A | Discharge status: Home / Self Care | Payer: Medicaid Other | Source: Ambulatory Visit | Attending: Radiation Oncology | Admitting: Radiation Oncology

## 2017-06-13 ENCOUNTER — Ambulatory Visit (HOSPITAL_BASED_OUTPATIENT_CLINIC_OR_DEPARTMENT_OTHER): Payer: Medicaid Other | Admitting: Internal Medicine

## 2017-06-13 ENCOUNTER — Ambulatory Visit (HOSPITAL_BASED_OUTPATIENT_CLINIC_OR_DEPARTMENT_OTHER): Payer: Medicaid Other

## 2017-06-13 ENCOUNTER — Other Ambulatory Visit (HOSPITAL_BASED_OUTPATIENT_CLINIC_OR_DEPARTMENT_OTHER): Payer: Medicaid Other

## 2017-06-13 ENCOUNTER — Encounter: Payer: Self-pay | Admitting: Internal Medicine

## 2017-06-13 VITALS — BP 139/94 | HR 102 | Temp 98.4°F | Resp 18 | Ht 64.0 in | Wt 153.2 lb

## 2017-06-13 VITALS — BP 143/89 | HR 93 | Temp 99.2°F | Resp 18

## 2017-06-13 DIAGNOSIS — C3412 Malignant neoplasm of upper lobe, left bronchus or lung: Secondary | ICD-10-CM

## 2017-06-13 DIAGNOSIS — C3492 Malignant neoplasm of unspecified part of left bronchus or lung: Secondary | ICD-10-CM

## 2017-06-13 DIAGNOSIS — F419 Anxiety disorder, unspecified: Secondary | ICD-10-CM

## 2017-06-13 DIAGNOSIS — Z5111 Encounter for antineoplastic chemotherapy: Secondary | ICD-10-CM

## 2017-06-13 DIAGNOSIS — Z7189 Other specified counseling: Secondary | ICD-10-CM

## 2017-06-13 DIAGNOSIS — Z510 Encounter for antineoplastic radiation therapy: Secondary | ICD-10-CM | POA: Diagnosis not present

## 2017-06-13 DIAGNOSIS — C3490 Malignant neoplasm of unspecified part of unspecified bronchus or lung: Secondary | ICD-10-CM

## 2017-06-13 DIAGNOSIS — R110 Nausea: Secondary | ICD-10-CM | POA: Diagnosis not present

## 2017-06-13 HISTORY — DX: Anxiety disorder, unspecified: F41.9

## 2017-06-13 LAB — CBC WITH DIFFERENTIAL/PLATELET
BASO%: 0.4 % (ref 0.0–2.0)
BASOS ABS: 0 10*3/uL (ref 0.0–0.1)
EOS ABS: 0 10*3/uL (ref 0.0–0.5)
EOS%: 0.5 % (ref 0.0–7.0)
HEMATOCRIT: 33.5 % — AB (ref 34.8–46.6)
HEMOGLOBIN: 11.4 g/dL — AB (ref 11.6–15.9)
LYMPH#: 1 10*3/uL (ref 0.9–3.3)
LYMPH%: 17.2 % (ref 14.0–49.7)
MCH: 30.2 pg (ref 25.1–34.0)
MCHC: 34 g/dL (ref 31.5–36.0)
MCV: 88.6 fL (ref 79.5–101.0)
MONO#: 0.6 10*3/uL (ref 0.1–0.9)
MONO%: 11.2 % (ref 0.0–14.0)
NEUT%: 70.7 % (ref 38.4–76.8)
NEUTROS ABS: 3.9 10*3/uL (ref 1.5–6.5)
Platelets: 297 10*3/uL (ref 145–400)
RBC: 3.78 10*6/uL (ref 3.70–5.45)
RDW: 14.3 % (ref 11.2–14.5)
WBC: 5.5 10*3/uL (ref 3.9–10.3)

## 2017-06-13 LAB — COMPREHENSIVE METABOLIC PANEL
ALT: 15 U/L (ref 0–55)
ANION GAP: 10 meq/L (ref 3–11)
AST: 15 U/L (ref 5–34)
Albumin: 3.1 g/dL — ABNORMAL LOW (ref 3.5–5.0)
Alkaline Phosphatase: 166 U/L — ABNORMAL HIGH (ref 40–150)
BUN: 6.4 mg/dL — ABNORMAL LOW (ref 7.0–26.0)
CHLORIDE: 98 meq/L (ref 98–109)
CO2: 24 meq/L (ref 22–29)
Calcium: 9.6 mg/dL (ref 8.4–10.4)
Creatinine: 0.7 mg/dL (ref 0.6–1.1)
Glucose: 100 mg/dl (ref 70–140)
POTASSIUM: 4 meq/L (ref 3.5–5.1)
SODIUM: 132 meq/L — AB (ref 136–145)
Total Bilirubin: 0.37 mg/dL (ref 0.20–1.20)
Total Protein: 6.8 g/dL (ref 6.4–8.3)

## 2017-06-13 MED ORDER — DIPHENHYDRAMINE HCL 50 MG/ML IJ SOLN
50.0000 mg | Freq: Once | INTRAMUSCULAR | Status: AC
  Administered 2017-06-13: 50 mg via INTRAVENOUS

## 2017-06-13 MED ORDER — METHYLPREDNISOLONE SODIUM SUCC 125 MG IJ SOLR
INTRAMUSCULAR | Status: AC
  Filled 2017-06-13: qty 2

## 2017-06-13 MED ORDER — FAMOTIDINE IN NACL 20-0.9 MG/50ML-% IV SOLN
INTRAVENOUS | Status: AC
  Filled 2017-06-13: qty 50

## 2017-06-13 MED ORDER — PALONOSETRON HCL INJECTION 0.25 MG/5ML
0.2500 mg | Freq: Once | INTRAVENOUS | Status: AC
  Administered 2017-06-13: 0.25 mg via INTRAVENOUS

## 2017-06-13 MED ORDER — DEXAMETHASONE SODIUM PHOSPHATE 100 MG/10ML IJ SOLN
20.0000 mg | Freq: Once | INTRAMUSCULAR | Status: AC
  Administered 2017-06-13: 20 mg via INTRAVENOUS
  Filled 2017-06-13: qty 2

## 2017-06-13 MED ORDER — HEPARIN SOD (PORK) LOCK FLUSH 100 UNIT/ML IV SOLN
500.0000 [IU] | Freq: Once | INTRAVENOUS | Status: AC | PRN
  Administered 2017-06-13: 500 [IU]
  Filled 2017-06-13: qty 5

## 2017-06-13 MED ORDER — SODIUM CHLORIDE 0.9% FLUSH
10.0000 mL | INTRAVENOUS | Status: DC | PRN
  Administered 2017-06-13: 10 mL
  Filled 2017-06-13: qty 10

## 2017-06-13 MED ORDER — PALONOSETRON HCL INJECTION 0.25 MG/5ML
INTRAVENOUS | Status: AC
  Filled 2017-06-13: qty 5

## 2017-06-13 MED ORDER — SODIUM CHLORIDE 0.9 % IV SOLN
Freq: Once | INTRAVENOUS | Status: AC
  Administered 2017-06-13: 12:00:00 via INTRAVENOUS

## 2017-06-13 MED ORDER — SODIUM CHLORIDE 0.9 % IV SOLN
243.4000 mg | Freq: Once | INTRAVENOUS | Status: AC
  Administered 2017-06-13: 240 mg via INTRAVENOUS
  Filled 2017-06-13: qty 24

## 2017-06-13 MED ORDER — DIPHENHYDRAMINE HCL 50 MG/ML IJ SOLN
INTRAMUSCULAR | Status: AC
  Filled 2017-06-13: qty 1

## 2017-06-13 MED ORDER — ONDANSETRON HCL 8 MG PO TABS: 8.0000 mg | ORAL_TABLET | Freq: Three times a day (TID) | ORAL | 0 refills | Status: AC | PRN

## 2017-06-13 MED ORDER — PACLITAXEL CHEMO INJECTION 300 MG/50ML
45.0000 mg/m2 | Freq: Once | INTRAVENOUS | Status: AC
  Administered 2017-06-13: 78 mg via INTRAVENOUS
  Filled 2017-06-13: qty 13

## 2017-06-13 MED ORDER — FAMOTIDINE IN NACL 20-0.9 MG/50ML-% IV SOLN
20.0000 mg | Freq: Once | INTRAVENOUS | Status: AC
  Administered 2017-06-13: 20 mg via INTRAVENOUS

## 2017-06-13 MED ORDER — ALPRAZOLAM 0.5 MG PO TABS: 0.5000 mg | ORAL_TABLET | Freq: Three times a day (TID) | ORAL | 0 refills | Status: DC | PRN

## 2017-06-13 MED ORDER — METHYLPREDNISOLONE SODIUM SUCC 125 MG IJ SOLR
125.0000 mg | Freq: Once | INTRAMUSCULAR | Status: AC | PRN
  Administered 2017-06-13: 60 mg via INTRAVENOUS

## 2017-06-13 NOTE — Patient Instructions (Signed)
[facility] Discharge Instructions for Patients Receiving Chemotherapy  Today you received the following chemotherapy agents:  Taxol & Carboplatin  To help prevent nausea and vomiting after your treatment, we encourage you to take your nausea medication.   If you develop nausea and vomiting that is not controlled by your nausea medication, call the clinic.   BELOW ARE SYMPTOMS THAT SHOULD BE REPORTED IMMEDIATELY:  *FEVER GREATER THAN 100.5 F  *CHILLS WITH OR WITHOUT FEVER  NAUSEA AND VOMITING THAT IS NOT CONTROLLED WITH YOUR NAUSEA MEDICATION  *UNUSUAL SHORTNESS OF BREATH  *UNUSUAL BRUISING OR BLEEDING  TENDERNESS IN MOUTH AND THROAT WITH OR WITHOUT PRESENCE OF ULCERS  *URINARY PROBLEMS  *BOWEL PROBLEMS  UNUSUAL RASH Items with * indicate a potential emergency and should be followed up as soon as possible.  Feel free to call the clinic should you have any questions or concerns. The clinic phone number is [phone].  Please show the [facility] at check-in to the Emergency Department and triage nurse.

## 2017-06-13 NOTE — Patient Instructions (Signed)
Implanted Port Home Guide An implanted port is a type of central line that is placed under the skin. Central lines are used to provide IV access when treatment or nutrition needs to be given through a person's veins. Implanted ports are used for long-term IV access. An implanted port may be placed because:  You need IV medicine that would be irritating to the small veins in your hands or arms.  You need long-term IV medicines, such as antibiotics.  You need IV nutrition for a long period.  You need frequent blood draws for lab tests.  You need dialysis.  Implanted ports are usually placed in the chest area, but they can also be placed in the upper arm, the abdomen, or the leg. An implanted port has two main parts:  Reservoir. The reservoir is round and will appear as a small, raised area under your skin. The reservoir is the part where a needle is inserted to give medicines or draw blood.  Catheter. The catheter is a thin, flexible tube that extends from the reservoir. The catheter is placed into a large vein. Medicine that is inserted into the reservoir goes into the catheter and then into the vein.  How will I care for my incision site? Do not get the incision site wet. Bathe or shower as directed by your health care provider. How is my port accessed? Special steps must be taken to access the port:  Before the port is accessed, a numbing cream can be placed on the skin. This helps numb the skin over the port site.  Your health care provider uses a sterile technique to access the port. ? Your health care provider must put on a mask and sterile gloves. ? The skin over your port is cleaned carefully with an antiseptic and allowed to dry. ? The port is gently pinched between sterile gloves, and a needle is inserted into the port.  Only "non-coring" port needles should be used to access the port. Once the port is accessed, a blood return should be checked. This helps ensure that the port  is in the vein and is not clogged.  If your port needs to remain accessed for a constant infusion, a clear (transparent) bandage will be placed over the needle site. The bandage and needle will need to be changed every week, or as directed by your health care provider.  Keep the bandage covering the needle clean and dry. Do not get it wet. Follow your health care provider's instructions on how to take a shower or bath while the port is accessed.  If your port does not need to stay accessed, no bandage is needed over the port.  What is flushing? Flushing helps keep the port from getting clogged. Follow your health care provider's instructions on how and when to flush the port. Ports are usually flushed with saline solution or a medicine called heparin. The need for flushing will depend on how the port is used.  If the port is used for intermittent medicines or blood draws, the port will need to be flushed: ? After medicines have been given. ? After blood has been drawn. ? As part of routine maintenance.  If a constant infusion is running, the port may not need to be flushed.  How long will my port stay implanted? The port can stay in for as long as your health care provider thinks it is needed. When it is time for the port to come out, surgery will be   done to remove it. The procedure is similar to the one performed when the port was put in. When should I seek immediate medical care? When you have an implanted port, you should seek immediate medical care if:  You notice a bad smell coming from the incision site.  You have swelling, redness, or drainage at the incision site.  You have more swelling or pain at the port site or the surrounding area.  You have a fever that is not controlled with medicine.  This information is not intended to replace advice given to you by your health care provider. Make sure you discuss any questions you have with your health care provider. Document  Released: 08/16/2005 Document Revised: 01/22/2016 Document Reviewed: 04/23/2013 Elsevier Interactive Patient Education  2017 [employer]  

## 2017-06-13 NOTE — Telephone Encounter (Signed)
Gave avs and calendar for October  °

## 2017-06-13 NOTE — Progress Notes (Signed)
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  PDL1 expression: 20%.  PRIOR THERAPY: None.  CURRENT THERAPY: A course of concurrent chemoradiation with weekly carboplatin for AUC of 2 and paclitaxel 45 MG/M2. Status post 3 cycles.  INTERVAL HISTORY: Raven Cochran 46 y.o. female returns to the clinic today for follow-up visit accompanied by a friend. The patient continues to tolerate this course of concurrent chemoradiation fairly well except for the hoarseness of her voice and mild swallowing difficulty. She denied having any odynophagia. She denied having any chest pain, shortness of breath, cough or hemoptysis. She has mild nausea after the last cycle of her treatment and she is requesting a different anti-emetic medications. She is here today for evaluation before starting cycle #4.  MEDICAL HISTORY: Past Medical History:  Diagnosis Date  . Encounter for antineoplastic chemotherapy 05/12/2017  . Headache    hx migraines none in 10 yrs  . Lung mass    left hilary mass  . PONV (postoperative nausea and vomiting)   . UTI (urinary tract infection) 9 weeks ago    ALLERGIES:  has No Known Allergies.  MEDICATIONS:  Current Outpatient Prescriptions  Medication Sig Dispense Refill  . acetaminophen (TYLENOL) 500 MG tablet Take 500 mg by mouth every 6 (six) hours as needed for moderate pain.    Marland Kitchen ALPRAZolam (XANAX) 0.5 MG tablet Take 1 tablet (0.5 mg total) by mouth 3 (three) times daily as needed for anxiety. 30 tablet 0  . levonorgestrel (MIRENA) 20 MCG/24HR IUD 1 each by Intrauterine route once. Inserted 8 yrs ago    .  lidocaine-prilocaine (EMLA) cream Apply 1 application topically as needed. 30 g 0  . naproxen sodium (ANAPROX) 220 MG tablet Take 440 mg by mouth daily as needed (pain).    . prochlorperazine (COMPAZINE) 10 MG tablet Take 1 tablet (10 mg total) by mouth every 6 (six) hours as needed for nausea or vomiting. 30 tablet 1  . Wound Dressings (SONAFINE EX) Apply topically.     No current facility-administered medications for this visit.     SURGICAL HISTORY:  Past Surgical History:  Procedure Laterality Date  . BREAST SURGERY Bilateral 2000   augmentation  . CESAREAN SECTION  2006   x 1   . ENDOBRONCHIAL ULTRASOUND Bilateral 05/03/2017   Procedure: ENDOBRONCHIAL ULTRASOUND;  Surgeon: Rigoberto Noel, MD;  Location: [facility];  Service: Cardiopulmonary;  Laterality: Bilateral;  . IR FLUORO GUIDE PORT INSERTION RIGHT  05/19/2017  . IR US GUIDE VASC ACCESS RIGHT  05/19/2017    REVIEW OF SYSTEMS:  A comprehensive review of systems was negative except for: Ears, nose, mouth, throat, and face: positive for hoarseness Behavioral/Psych: positive for anxiety   PHYSICAL EXAMINATION: General appearance: alert, cooperative and no distress Head: Normocephalic, without obvious abnormality, atraumatic Neck: no adenopathy, no JVD, supple, symmetrical, trachea midline and thyroid not enlarged, symmetric, no tenderness/mass/nodules Lymph nodes: Cervical, supraclavicular, and axillary nodes normal. Resp: clear to auscultation bilaterally Back: symmetric, no curvature. ROM normal. No CVA tenderness. Cardio: regular rate and rhythm, S1, S2 normal, no murmur, click, rub or gallop GI: soft, non-tender; bowel sounds normal; no masses,  no  organomegaly Extremities: extremities normal, atraumatic, no cyanosis or edema  ECOG PERFORMANCE STATUS: 1 - Symptomatic but completely ambulatory  Blood pressure (!) 139/94, pulse (!) 102, temperature 98.4 F (36.9 C), temperature source Oral, resp. rate 18, height _0   (1.626 m), weight 153 lb 3.2 oz (69.5 kg), SpO2 97 %.  LABORATORY DATA: Lab Results  Component Value Date   WBC 5.2 06/06/2017   HGB 10.9 (L) 06/06/2017   HCT 32.6 (L) 06/06/2017   MCV 88.8 06/06/2017   PLT 368 06/06/2017      Chemistry      Component Value Date/Time   NA 133 (L) 06/06/2017 0946   K 4.0 06/06/2017 0946   CL 97 (L) 05/19/2017 1029   CO2 25 06/06/2017 0946   BUN 5.5 (L) 06/06/2017 0946   CREATININE 0.6 06/06/2017 0946      Component Value Date/Time   CALCIUM 9.3 06/06/2017 0946   ALKPHOS 175 (H) 06/06/2017 0946   AST 15 06/06/2017 0946   ALT 19 06/06/2017 0946   BILITOT 0.33 06/06/2017 0946       RADIOGRAPHIC STUDIES: Mr Jeri Cos BS Contrast  Result Date: 05/20/2017 CLINICAL DATA:  Non-small-cell lung cancer. Staging. No neurologic complaints are reported. EXAM: MRI HEAD WITHOUT AND WITH CONTRAST TECHNIQUE: Multiplanar, multiecho pulse sequences of the brain and surrounding structures were obtained without and with intravenous contrast. CONTRAST:  18m MULTIHANCE GADOBENATE DIMEGLUMINE 529 MG/ML IV SOLN COMPARISON:  PET scan 04/26/2017. FINDINGS: Brain: No evidence for acute infarction, hemorrhage, mass lesion, hydrocephalus, or extra-axial fluid. Normal cerebral volume. No white matter disease. Post infusion, no abnormal enhancement of the brain or meninges. Vascular: Normal flow voids.  Partial empty sella. Skull and upper cervical spine: Normal marrow signal. Sinuses/Orbits: Negative. Other: None. IMPRESSION: Negative cranial MRI. No evidence for intracranial metastatic disease. Electronically Signed   By: JStaci RighterM.D.   On: 05/20/2017 11:35   Ir [place] Vasc Access Right  Result Date: 05/20/2017 CLINICAL DATA:  Lung carcinoma, needs durable venous access for chemotherapy regimen. EXAM: TUNNELED PORT CATHETER PLACEMENT WITH ULTRASOUND AND FLUOROSCOPIC GUIDANCE FLUOROSCOPY TIME:  113m, 6 seconds ANESTHESIA/SEDATION: Intravenous Fentanyl and Versed were  administered as conscious sedation during continuous monitoring of the patient's level of consciousness and physiological / cardiorespiratory status by the radiology RN, with a total moderate sedation time of 15 minutes. TECHNIQUE: The procedure, risks, benefits, and alternatives were explained to the patient. Questions regarding the procedure were encouraged and answered. The patient understands and consents to the procedure. As antibiotic prophylaxis, cefazolin 2 g was ordered pre-procedure and administered intravenously within one hour of incision. Patency of the right IJ vein was confirmed with ultrasound with image documentation. An appropriate skin site was determined. Skin site was marked. Region was prepped using maximum barrier technique including cap and mask, sterile gown, sterile gloves, large sterile sheet, and Chlorhexidine as cutaneous antisepsis. The region was infiltrated locally with 1% lidocaine. Under real-time ultrasound guidance, the right IJ vein was accessed with a 21 gauge micropuncture needle; the needle tip within the vein was confirmed with ultrasound image documentation. Needle was exchanged over a 018 guidewire for transitional dilator which allowed passage of the [facility] into the IVC. Over this, the transitional dilator was exchanged for a 5 [place] catheter. A small incision was made on the right anterior chest wall and a subcutaneous pocket fashioned. The power-injectable port was positioned and its catheter tunneled to the right IJ dermatotomy site. The MPA catheter was exchanged  over an Amplatz wire for a peel-away sheath, through which the port catheter, which had been trimmed to the appropriate length, was advanced and positioned under fluoroscopy with its tip at the cavoatrial junction. Spot chest radiograph confirms good catheter position and no pneumothorax. The pocket was closed with deep interrupted and subcuticular continuous 3-0 Monocryl sutures. The port was  flushed per protocol. The incisions were covered with Dermabond then covered with a sterile dressing. COMPLICATIONS: COMPLICATIONS None immediate IMPRESSION: Technically successful right IJ power-injectable port catheter placement. Ready for routine use. Electronically Signed   By: Lucrezia Europe M.D.   On: 05/20/2017 09:00   Ir Fluoro Guide Port Insertion Right  Result Date: 05/20/2017 CLINICAL DATA:  Lung carcinoma, needs durable venous access for chemotherapy regimen. EXAM: TUNNELED PORT CATHETER PLACEMENT WITH ULTRASOUND AND FLUOROSCOPIC GUIDANCE FLUOROSCOPY TIME:  16my, 6 seconds ANESTHESIA/SEDATION: Intravenous Fentanyl and Versed were administered as conscious sedation during continuous monitoring of the patient's level of consciousness and physiological / cardiorespiratory status by the radiology RN, with a total moderate sedation time of 15 minutes. TECHNIQUE: The procedure, risks, benefits, and alternatives were explained to the patient. Questions regarding the procedure were encouraged and answered. The patient understands and consents to the procedure. As antibiotic prophylaxis, cefazolin 2 g was ordered pre-procedure and administered intravenously within one hour of incision. Patency of the right IJ vein was confirmed with ultrasound with image documentation. An appropriate skin site was determined. Skin site was marked. Region was prepped using maximum barrier technique including cap and mask, sterile gown, sterile gloves, large sterile sheet, and Chlorhexidine as cutaneous antisepsis. The region was infiltrated locally with 1% lidocaine. Under real-time ultrasound guidance, the right IJ vein was accessed with a 21 gauge micropuncture needle; the needle tip within the vein was confirmed with ultrasound image documentation. Needle was exchanged over a 018 guidewire for transitional dilator which allowed passage of the [facility] into the IVC. Over this, the transitional dilator was exchanged for a 5  [place]A catheter. A small incision was made on the right anterior chest wall and a subcutaneous pocket fashioned. The power-injectable port was positioned and its catheter tunneled to the right IJ dermatotomy site. The MPA catheter was exchanged over an Amplatz wire for a peel-away sheath, through which the port catheter, which had been trimmed to the appropriate length, was advanced and positioned under fluoroscopy with its tip at the cavoatrial junction. Spot chest radiograph confirms good catheter position and no pneumothorax. The pocket was closed with deep interrupted and subcuticular continuous 3-0 Monocryl sutures. The port was flushed per protocol. The incisions were covered with Dermabond then covered with a sterile dressing. COMPLICATIONS: COMPLICATIONS None immediate IMPRESSION: Technically successful right IJ power-injectable port catheter placement. Ready for routine use. Electronically Signed   By: DLucrezia EuropeM.D.   On: 05/20/2017 09:00    ASSESSMENT AND PLAN: this is a very pleasant 46years old white female with a stage IIIa non-small cell lung cancer, squamous cell carcinoma with PDL 1 expression of 20% The patient is currently undergoing a course of concurrent chemoradiation with weekly carboplatin and paclitaxel status post 3 cycles. She continues to tolerate this treatment fairly well with no significant adverse effects. I recommended for the patient to proceed with cycle #4 today as a scheduled. For the nausea, I gave the patient prescription for Zofran 8 mg by mouth Q8 hours as needed. For anxiety, I gave her a refill for Xanax. The patient will come back for follow-up  visit in 2 weeks for evaluation and management of any adverse effect of her treatment. He patient was advised to call immediately if she has any concerning symptoms in the interval. The patient voices understanding of current disease status and treatment options and is in agreement with the current care plan.  All  questions were answered. The patient knows to call the clinic with any problems, questions or concerns. We can certainly see the patient much sooner if necessary.  I spent 10 minutes counseling the patient face to face. The total time spent in the appointment was 15 minutes.  Disclaimer: This note was dictated with voice recognition software. Similar sounding words can inadvertently be transcribed and may not be corrected upon review.

## 2017-06-14 ENCOUNTER — Telehealth: Payer: Self-pay | Admitting: Oncology

## 2017-06-14 ENCOUNTER — Other Ambulatory Visit: Payer: Self-pay | Admitting: Radiation Oncology

## 2017-06-14 ENCOUNTER — Ambulatory Visit
Admission: RE | Admit: 2017-06-14 | Discharge: 2017-06-14 | Disposition: A | Discharge status: Home / Self Care | Payer: Medicaid Other | Source: Ambulatory Visit | Attending: Radiation Oncology | Admitting: Radiation Oncology

## 2017-06-14 ENCOUNTER — Encounter: Payer: Self-pay | Admitting: Internal Medicine

## 2017-06-14 DIAGNOSIS — C3492 Malignant neoplasm of unspecified part of left bronchus or lung: Secondary | ICD-10-CM

## 2017-06-14 DIAGNOSIS — Z510 Encounter for antineoplastic radiation therapy: Secondary | ICD-10-CM | POA: Diagnosis not present

## 2017-06-14 MED ORDER — SUCRALFATE 1 G PO TABS: 1.0000 g | ORAL_TABLET | Freq: Three times a day (TID) | ORAL | 1 refills | Status: DC

## 2017-06-14 NOTE — Telephone Encounter (Signed)
Called in prescription for Carafate to [facility] per Dr. Sondra Come.

## 2017-06-15 ENCOUNTER — Ambulatory Visit
Admission: RE | Admit: 2017-06-15 | Discharge: 2017-06-15 | Disposition: A | Discharge status: Home / Self Care | Payer: Medicaid Other | Source: Ambulatory Visit | Attending: Radiation Oncology | Admitting: Radiation Oncology

## 2017-06-15 DIAGNOSIS — Z510 Encounter for antineoplastic radiation therapy: Secondary | ICD-10-CM | POA: Diagnosis not present

## 2017-06-16 ENCOUNTER — Ambulatory Visit
Admission: RE | Admit: 2017-06-16 | Discharge: 2017-06-16 | Disposition: A | Discharge status: Home / Self Care | Payer: Medicaid Other | Source: Ambulatory Visit | Attending: Radiation Oncology | Admitting: Radiation Oncology

## 2017-06-16 DIAGNOSIS — Z510 Encounter for antineoplastic radiation therapy: Secondary | ICD-10-CM | POA: Diagnosis not present

## 2017-06-16 NOTE — Telephone Encounter (Signed)
Patient said [facility] does not have a record of the Carafate prescription.  Called [facility] and called in the prescription to the pharmacist.  Notified patient.

## 2017-06-17 ENCOUNTER — Ambulatory Visit
Admission: RE | Admit: 2017-06-17 | Discharge: 2017-06-17 | Disposition: A | Discharge status: Home / Self Care | Payer: Medicaid Other | Source: Ambulatory Visit | Attending: Radiation Oncology | Admitting: Radiation Oncology

## 2017-06-17 DIAGNOSIS — Z510 Encounter for antineoplastic radiation therapy: Secondary | ICD-10-CM | POA: Diagnosis not present

## 2017-06-20 ENCOUNTER — Ambulatory Visit
Admission: RE | Admit: 2017-06-20 | Discharge: 2017-06-20 | Disposition: A | Discharge status: Home / Self Care | Payer: Medicaid Other | Source: Ambulatory Visit | Attending: Radiation Oncology | Admitting: Radiation Oncology

## 2017-06-20 ENCOUNTER — Other Ambulatory Visit (HOSPITAL_BASED_OUTPATIENT_CLINIC_OR_DEPARTMENT_OTHER): Payer: Medicaid Other

## 2017-06-20 ENCOUNTER — Ambulatory Visit: Payer: Self-pay

## 2017-06-20 ENCOUNTER — Ambulatory Visit (HOSPITAL_BASED_OUTPATIENT_CLINIC_OR_DEPARTMENT_OTHER): Payer: Medicaid Other

## 2017-06-20 VITALS — BP 140/93 | HR 97 | Temp 97.8°F | Resp 18

## 2017-06-20 DIAGNOSIS — Z7189 Other specified counseling: Secondary | ICD-10-CM

## 2017-06-20 DIAGNOSIS — C3412 Malignant neoplasm of upper lobe, left bronchus or lung: Secondary | ICD-10-CM | POA: Diagnosis not present

## 2017-06-20 DIAGNOSIS — Z95828 Presence of other vascular implants and grafts: Secondary | ICD-10-CM

## 2017-06-20 DIAGNOSIS — C3490 Malignant neoplasm of unspecified part of unspecified bronchus or lung: Secondary | ICD-10-CM

## 2017-06-20 DIAGNOSIS — C3492 Malignant neoplasm of unspecified part of left bronchus or lung: Secondary | ICD-10-CM

## 2017-06-20 DIAGNOSIS — Z5111 Encounter for antineoplastic chemotherapy: Secondary | ICD-10-CM | POA: Diagnosis present

## 2017-06-20 DIAGNOSIS — Z510 Encounter for antineoplastic radiation therapy: Secondary | ICD-10-CM | POA: Diagnosis not present

## 2017-06-20 LAB — COMPREHENSIVE METABOLIC PANEL
ALT: 10 U/L (ref 0–55)
AST: 11 U/L (ref 5–34)
Albumin: 3.2 g/dL — ABNORMAL LOW (ref 3.5–5.0)
Alkaline Phosphatase: 136 U/L (ref 40–150)
Anion Gap: 10 mEq/L (ref 3–11)
BUN: 5.3 mg/dL — AB (ref 7.0–26.0)
CALCIUM: 9.7 mg/dL (ref 8.4–10.4)
CHLORIDE: 96 meq/L — AB (ref 98–109)
CO2: 27 meq/L (ref 22–29)
CREATININE: 0.6 mg/dL (ref 0.6–1.1)
EGFR: >60 mL/min/{1.73_m2} (ref >60)
Glucose: 111 mg/dl (ref 70–140)
Potassium: 3.7 mEq/L (ref 3.5–5.1)
Sodium: 133 mEq/L — ABNORMAL LOW (ref 136–145)
Total Bilirubin: 0.79 mg/dL (ref 0.20–1.20)
Total Protein: 6.8 g/dL (ref 6.4–8.3)

## 2017-06-20 LAB — CBC WITH DIFFERENTIAL/PLATELET
BASO%: 0.2 % (ref 0.0–2.0)
BASOS ABS: 0 10*3/uL (ref 0.0–0.1)
EOS ABS: 0 10*3/uL (ref 0.0–0.5)
EOS%: 0.7 % (ref 0.0–7.0)
HCT: 34.7 % — ABNORMAL LOW (ref 34.8–46.6)
HEMOGLOBIN: 11.7 g/dL (ref 11.6–15.9)
LYMPH%: 7.7 % — ABNORMAL LOW (ref 14.0–49.7)
MCH: 30.1 pg (ref 25.1–34.0)
MCHC: 33.7 g/dL (ref 31.5–36.0)
MCV: 89.2 fL (ref 79.5–101.0)
MONO#: 0.4 10*3/uL (ref 0.1–0.9)
MONO%: 7.6 % (ref 0.0–14.0)
NEUT%: 83.8 % — ABNORMAL HIGH (ref 38.4–76.8)
NEUTROS ABS: 4.9 10*3/uL (ref 1.5–6.5)
PLATELETS: 208 10*3/uL (ref 145–400)
RBC: 3.89 10*6/uL (ref 3.70–5.45)
RDW: 14.7 % — ABNORMAL HIGH (ref 11.2–14.5)
WBC: 5.8 10*3/uL (ref 3.9–10.3)
lymph#: 0.5 10*3/uL — ABNORMAL LOW (ref 0.9–3.3)

## 2017-06-20 MED ORDER — DIPHENHYDRAMINE HCL 50 MG/ML IJ SOLN
50.0000 mg | Freq: Once | INTRAMUSCULAR | Status: AC
  Administered 2017-06-20: 50 mg via INTRAVENOUS

## 2017-06-20 MED ORDER — FAMOTIDINE IN NACL 20-0.9 MG/50ML-% IV SOLN
INTRAVENOUS | Status: AC
  Filled 2017-06-20: qty 50

## 2017-06-20 MED ORDER — SODIUM CHLORIDE 0.9 % IV SOLN
20.0000 mg | Freq: Once | INTRAVENOUS | Status: AC
  Administered 2017-06-20: 20 mg via INTRAVENOUS
  Filled 2017-06-20: qty 2

## 2017-06-20 MED ORDER — HEPARIN SOD (PORK) LOCK FLUSH 100 UNIT/ML IV SOLN
500.0000 [IU] | Freq: Once | INTRAVENOUS | Status: AC | PRN
  Administered 2017-06-20: 500 [IU]
  Filled 2017-06-20: qty 5

## 2017-06-20 MED ORDER — SODIUM CHLORIDE 0.9% FLUSH
10.0000 mL | INTRAVENOUS | Status: DC | PRN
  Administered 2017-06-20: 10 mL
  Filled 2017-06-20: qty 10

## 2017-06-20 MED ORDER — SODIUM CHLORIDE 0.9 % IV SOLN
243.4000 mg | Freq: Once | INTRAVENOUS | Status: AC
  Administered 2017-06-20: 240 mg via INTRAVENOUS
  Filled 2017-06-20: qty 24

## 2017-06-20 MED ORDER — SODIUM CHLORIDE 0.9% FLUSH
10.0000 mL | INTRAVENOUS | Status: DC | PRN
  Administered 2017-06-20: 10 mL via INTRAVENOUS
  Filled 2017-06-20: qty 10

## 2017-06-20 MED ORDER — SODIUM CHLORIDE 0.9 % IV SOLN
Freq: Once | INTRAVENOUS | Status: AC
  Administered 2017-06-20: 10:00:00 via INTRAVENOUS

## 2017-06-20 MED ORDER — PALONOSETRON HCL INJECTION 0.25 MG/5ML
INTRAVENOUS | Status: AC
  Filled 2017-06-20: qty 5

## 2017-06-20 MED ORDER — SODIUM CHLORIDE 0.9 % IV SOLN
45.0000 mg/m2 | Freq: Once | INTRAVENOUS | Status: AC
  Administered 2017-06-20: 78 mg via INTRAVENOUS
  Filled 2017-06-20: qty 13

## 2017-06-20 MED ORDER — FAMOTIDINE IN NACL 20-0.9 MG/50ML-% IV SOLN
20.0000 mg | Freq: Once | INTRAVENOUS | Status: AC
  Administered 2017-06-20: 20 mg via INTRAVENOUS

## 2017-06-20 MED ORDER — PALONOSETRON HCL INJECTION 0.25 MG/5ML
0.2500 mg | Freq: Once | INTRAVENOUS | Status: AC
  Administered 2017-06-20: 0.25 mg via INTRAVENOUS

## 2017-06-20 MED ORDER — DIPHENHYDRAMINE HCL 50 MG/ML IJ SOLN
INTRAMUSCULAR | Status: AC
  Filled 2017-06-20: qty 1

## 2017-06-20 NOTE — Patient Instructions (Signed)
[facility] Discharge Instructions for Patients Receiving Chemotherapy  Today you received the following chemotherapy agents Carbo and Taxol  To help prevent nausea and vomiting after your treatment, we encourage you to take your nausea medication as directed  If you develop nausea and vomiting that is not controlled by your nausea medication, call the clinic.   BELOW ARE SYMPTOMS THAT SHOULD BE REPORTED IMMEDIATELY:  *FEVER GREATER THAN 100.5 F  *CHILLS WITH OR WITHOUT FEVER  NAUSEA AND VOMITING THAT IS NOT CONTROLLED WITH YOUR NAUSEA MEDICATION  *UNUSUAL SHORTNESS OF BREATH  *UNUSUAL BRUISING OR BLEEDING  TENDERNESS IN MOUTH AND THROAT WITH OR WITHOUT PRESENCE OF ULCERS  *URINARY PROBLEMS  *BOWEL PROBLEMS  UNUSUAL RASH Items with * indicate a potential emergency and should be followed up as soon as possible.  Feel free to call the clinic should you have any questions or concerns. The clinic phone number is [phone].  Please show the [facility] at check-in to the Emergency Department and triage nurse.

## 2017-06-20 NOTE — Progress Notes (Signed)
Pt complaining of nausea and vomiting. Patient states it started after her last treatment and had three episodes of vomiting within the last 24 hours. Pt states her nausea medicines does not work and makes her more nauseas. Dr. Julien Nordmann made aware and per Dr. Julien Nordmann nausea medication regimen to be reiterated on how to take it and he is okay to proceed with the treatment today.

## 2017-06-21 ENCOUNTER — Ambulatory Visit
Admission: RE | Admit: 2017-06-21 | Discharge: 2017-06-21 | Disposition: A | Discharge status: Home / Self Care | Payer: Medicaid Other | Source: Ambulatory Visit | Attending: Radiation Oncology | Admitting: Radiation Oncology

## 2017-06-21 ENCOUNTER — Telehealth: Payer: Self-pay | Admitting: Oncology

## 2017-06-21 ENCOUNTER — Other Ambulatory Visit: Payer: Self-pay | Admitting: Oncology

## 2017-06-21 DIAGNOSIS — Z510 Encounter for antineoplastic radiation therapy: Secondary | ICD-10-CM | POA: Diagnosis not present

## 2017-06-21 DIAGNOSIS — C3492 Malignant neoplasm of unspecified part of left bronchus or lung: Secondary | ICD-10-CM

## 2017-06-21 MED ORDER — MAGIC MOUTHWASH W/LIDOCAINE: 10.0000 mL | Freq: Four times a day (QID) | ORAL | 1 refills | Status: AC | PRN

## 2017-06-21 NOTE — Telephone Encounter (Signed)
Called in prescription for magic mouthwash with lidocaine to [facility].  Per the pharmacist, this is compounded and will be ready tomorrow.  Left a message for patient that medication will be ready tomorrow.

## 2017-06-21 NOTE — Telephone Encounter (Signed)
Called patient and advised her that she will receive fluids at the [facility] on Thursday at 11:30.  Also let her know that the completed handicap sticker paperwork will be given to the therapists and she can pick it up tomorrow.  She verbalized understanding.

## 2017-06-22 ENCOUNTER — Ambulatory Visit
Admission: RE | Admit: 2017-06-22 | Discharge: 2017-06-22 | Disposition: A | Discharge status: Home / Self Care | Payer: Medicaid Other | Source: Ambulatory Visit | Attending: Radiation Oncology | Admitting: Radiation Oncology

## 2017-06-22 DIAGNOSIS — Z510 Encounter for antineoplastic radiation therapy: Secondary | ICD-10-CM | POA: Diagnosis not present

## 2017-06-23 ENCOUNTER — Ambulatory Visit
Admission: RE | Admit: 2017-06-23 | Discharge: 2017-06-23 | Disposition: A | Discharge status: Home / Self Care | Payer: Medicaid Other | Source: Ambulatory Visit | Attending: Radiation Oncology | Admitting: Radiation Oncology

## 2017-06-23 ENCOUNTER — Ambulatory Visit (HOSPITAL_COMMUNITY)
Admission: RE | Admit: 2017-06-23 | Discharge: 2017-06-23 | Disposition: A | Discharge status: Home / Self Care | Payer: Medicaid Other | Source: Ambulatory Visit | Attending: Internal Medicine | Admitting: Internal Medicine

## 2017-06-23 DIAGNOSIS — C3492 Malignant neoplasm of unspecified part of left bronchus or lung: Secondary | ICD-10-CM | POA: Insufficient documentation

## 2017-06-23 DIAGNOSIS — Z510 Encounter for antineoplastic radiation therapy: Secondary | ICD-10-CM | POA: Diagnosis not present

## 2017-06-23 MED ORDER — SODIUM CHLORIDE 0.9% FLUSH
10.0000 mL | INTRAVENOUS | Status: AC | PRN
  Administered 2017-06-23: 10 mL

## 2017-06-23 MED ORDER — SODIUM CHLORIDE 0.9 % IV SOLN
INTRAVENOUS | Status: AC
  Administered 2017-06-23: 13:00:00 via INTRAVENOUS

## 2017-06-23 MED ORDER — HEPARIN SOD (PORK) LOCK FLUSH 100 UNIT/ML IV SOLN
500.0000 [IU] | INTRAVENOUS | Status: AC | PRN
  Administered 2017-06-23: 500 [IU]
  Filled 2017-06-23: qty 5

## 2017-06-23 NOTE — Progress Notes (Signed)
Arrived to [facility] for two hour hydration.  PAC accessed and IVF administered.  Pt tolerated well. PAC deaccessed. Discharge instructions reviewed. Pt alert, oriented and ambulatory.  Exit [facility] with husband.

## 2017-06-24 ENCOUNTER — Ambulatory Visit
Admission: RE | Admit: 2017-06-24 | Discharge: 2017-06-24 | Disposition: A | Discharge status: Home / Self Care | Payer: Medicaid Other | Source: Ambulatory Visit | Attending: Radiation Oncology | Admitting: Radiation Oncology

## 2017-06-24 DIAGNOSIS — Z510 Encounter for antineoplastic radiation therapy: Secondary | ICD-10-CM | POA: Diagnosis not present

## 2017-06-27 ENCOUNTER — Ambulatory Visit (HOSPITAL_BASED_OUTPATIENT_CLINIC_OR_DEPARTMENT_OTHER): Payer: Medicaid Other

## 2017-06-27 ENCOUNTER — Encounter: Payer: Self-pay | Admitting: *Deleted

## 2017-06-27 ENCOUNTER — Encounter: Payer: Self-pay | Admitting: Internal Medicine

## 2017-06-27 ENCOUNTER — Ambulatory Visit (HOSPITAL_BASED_OUTPATIENT_CLINIC_OR_DEPARTMENT_OTHER): Payer: Medicaid Other | Admitting: Internal Medicine

## 2017-06-27 ENCOUNTER — Telehealth: Payer: Self-pay | Admitting: Internal Medicine

## 2017-06-27 ENCOUNTER — Ambulatory Visit: Payer: Self-pay

## 2017-06-27 ENCOUNTER — Other Ambulatory Visit (HOSPITAL_BASED_OUTPATIENT_CLINIC_OR_DEPARTMENT_OTHER): Payer: Medicaid Other

## 2017-06-27 ENCOUNTER — Ambulatory Visit
Admission: RE | Admit: 2017-06-27 | Discharge: 2017-06-27 | Disposition: A | Discharge status: Home / Self Care | Payer: Medicaid Other | Source: Ambulatory Visit | Attending: Radiation Oncology | Admitting: Radiation Oncology

## 2017-06-27 VITALS — BP 141/77 | HR 99 | Temp 98.6°F | Resp 18 | Ht 64.0 in | Wt 150.4 lb

## 2017-06-27 DIAGNOSIS — C3490 Malignant neoplasm of unspecified part of unspecified bronchus or lung: Secondary | ICD-10-CM

## 2017-06-27 DIAGNOSIS — C3492 Malignant neoplasm of unspecified part of left bronchus or lung: Secondary | ICD-10-CM

## 2017-06-27 DIAGNOSIS — Z95828 Presence of other vascular implants and grafts: Secondary | ICD-10-CM

## 2017-06-27 DIAGNOSIS — C3412 Malignant neoplasm of upper lobe, left bronchus or lung: Secondary | ICD-10-CM | POA: Diagnosis not present

## 2017-06-27 DIAGNOSIS — Z7189 Other specified counseling: Secondary | ICD-10-CM

## 2017-06-27 DIAGNOSIS — Z5111 Encounter for antineoplastic chemotherapy: Secondary | ICD-10-CM | POA: Diagnosis present

## 2017-06-27 DIAGNOSIS — R1310 Dysphagia, unspecified: Secondary | ICD-10-CM | POA: Diagnosis not present

## 2017-06-27 DIAGNOSIS — Z510 Encounter for antineoplastic radiation therapy: Secondary | ICD-10-CM | POA: Diagnosis not present

## 2017-06-27 LAB — CBC WITH DIFFERENTIAL/PLATELET
BASO%: 0.5 % (ref 0.0–2.0)
Basophils Absolute: 0 10*3/uL (ref 0.0–0.1)
EOS ABS: 0 10*3/uL (ref 0.0–0.5)
EOS%: 0.8 % (ref 0.0–7.0)
HEMATOCRIT: 33 % — AB (ref 34.8–46.6)
HGB: 11.2 g/dL — ABNORMAL LOW (ref 11.6–15.9)
LYMPH#: 0.3 10*3/uL — AB (ref 0.9–3.3)
LYMPH%: 10.6 % — AB (ref 14.0–49.7)
MCH: 30.5 pg (ref 25.1–34.0)
MCHC: 34 g/dL (ref 31.5–36.0)
MCV: 89.6 fL (ref 79.5–101.0)
MONO#: 0.3 10*3/uL (ref 0.1–0.9)
MONO%: 8.6 % (ref 0.0–14.0)
NEUT%: 79.5 % — AB (ref 38.4–76.8)
NEUTROS ABS: 2.6 10*3/uL (ref 1.5–6.5)
PLATELETS: 164 10*3/uL (ref 145–400)
RBC: 3.69 10*6/uL — ABNORMAL LOW (ref 3.70–5.45)
RDW: 15.6 % — AB (ref 11.2–14.5)
WBC: 3.3 10*3/uL — AB (ref 3.9–10.3)

## 2017-06-27 LAB — COMPREHENSIVE METABOLIC PANEL
ALT: 11 U/L (ref 0–55)
ANION GAP: 9 meq/L (ref 3–11)
AST: 11 U/L (ref 5–34)
Albumin: 3.1 g/dL — ABNORMAL LOW (ref 3.5–5.0)
Alkaline Phosphatase: 129 U/L (ref 40–150)
BUN: 7.5 mg/dL (ref 7.0–26.0)
CALCIUM: 9.7 mg/dL (ref 8.4–10.4)
CO2: 24 meq/L (ref 22–29)
Chloride: 100 mEq/L (ref 98–109)
Creatinine: 0.7 mg/dL (ref 0.6–1.1)
Glucose: 113 mg/dl (ref 70–140)
Potassium: 3.7 mEq/L (ref 3.5–5.1)
Sodium: 133 mEq/L — ABNORMAL LOW (ref 136–145)
TOTAL PROTEIN: 6.7 g/dL (ref 6.4–8.3)
Total Bilirubin: 0.68 mg/dL (ref 0.20–1.20)

## 2017-06-27 MED ORDER — PALONOSETRON HCL INJECTION 0.25 MG/5ML
INTRAVENOUS | Status: AC
  Filled 2017-06-27: qty 5

## 2017-06-27 MED ORDER — SODIUM CHLORIDE 0.9 % IV SOLN
Freq: Once | INTRAVENOUS | Status: AC
  Administered 2017-06-27: 12:00:00 via INTRAVENOUS

## 2017-06-27 MED ORDER — CARBOPLATIN CHEMO INJECTION 450 MG/45ML
243.4000 mg | Freq: Once | INTRAVENOUS | Status: AC
  Administered 2017-06-27: 240 mg via INTRAVENOUS
  Filled 2017-06-27: qty 24

## 2017-06-27 MED ORDER — DIPHENHYDRAMINE HCL 50 MG/ML IJ SOLN
50.0000 mg | Freq: Once | INTRAMUSCULAR | Status: AC
  Administered 2017-06-27: 50 mg via INTRAVENOUS

## 2017-06-27 MED ORDER — SODIUM CHLORIDE 0.9% FLUSH
10.0000 mL | INTRAVENOUS | Status: DC | PRN
  Administered 2017-06-27: 10 mL via INTRAVENOUS
  Filled 2017-06-27: qty 10

## 2017-06-27 MED ORDER — FAMOTIDINE IN NACL 20-0.9 MG/50ML-% IV SOLN
INTRAVENOUS | Status: AC
  Filled 2017-06-27: qty 50

## 2017-06-27 MED ORDER — SODIUM CHLORIDE 0.9% FLUSH
10.0000 mL | INTRAVENOUS | Status: DC | PRN
  Administered 2017-06-27: 10 mL
  Filled 2017-06-27: qty 10

## 2017-06-27 MED ORDER — SODIUM CHLORIDE 0.9 % IV SOLN
45.0000 mg/m2 | Freq: Once | INTRAVENOUS | Status: AC
  Administered 2017-06-27: 78 mg via INTRAVENOUS
  Filled 2017-06-27: qty 13

## 2017-06-27 MED ORDER — FAMOTIDINE IN NACL 20-0.9 MG/50ML-% IV SOLN
20.0000 mg | Freq: Once | INTRAVENOUS | Status: AC
  Administered 2017-06-27: 20 mg via INTRAVENOUS

## 2017-06-27 MED ORDER — DIPHENHYDRAMINE HCL 50 MG/ML IJ SOLN
INTRAMUSCULAR | Status: AC
  Filled 2017-06-27: qty 1

## 2017-06-27 MED ORDER — HEPARIN SOD (PORK) LOCK FLUSH 100 UNIT/ML IV SOLN
500.0000 [IU] | Freq: Once | INTRAVENOUS | Status: AC | PRN
  Administered 2017-06-27: 500 [IU]
  Filled 2017-06-27: qty 5

## 2017-06-27 MED ORDER — PALONOSETRON HCL INJECTION 0.25 MG/5ML
0.2500 mg | Freq: Once | INTRAVENOUS | Status: AC
  Administered 2017-06-27: 0.25 mg via INTRAVENOUS

## 2017-06-27 MED ORDER — CARBOPLATIN CHEMO INJECTION 450 MG/45ML: 243.4000 mg | Freq: Once | INTRAVENOUS | Status: DC

## 2017-06-27 MED ORDER — DEXAMETHASONE SODIUM PHOSPHATE 100 MG/10ML IJ SOLN
20.0000 mg | Freq: Once | INTRAMUSCULAR | Status: AC
  Administered 2017-06-27: 20 mg via INTRAVENOUS
  Filled 2017-06-27: qty 2

## 2017-06-27 NOTE — Patient Instructions (Signed)
[facility] Discharge Instructions for Patients Receiving Chemotherapy  Today you received the following chemotherapy agents:  Carboplatin, Taxol  To help prevent nausea and vomiting after your treatment, we encourage you to take your nausea medication as prescribed.   If you develop nausea and vomiting that is not controlled by your nausea medication, call the clinic.   BELOW ARE SYMPTOMS THAT SHOULD BE REPORTED IMMEDIATELY:  *FEVER GREATER THAN 100.5 F  *CHILLS WITH OR WITHOUT FEVER  NAUSEA AND VOMITING THAT IS NOT CONTROLLED WITH YOUR NAUSEA MEDICATION  *UNUSUAL SHORTNESS OF BREATH  *UNUSUAL BRUISING OR BLEEDING  TENDERNESS IN MOUTH AND THROAT WITH OR WITHOUT PRESENCE OF ULCERS  *URINARY PROBLEMS  *BOWEL PROBLEMS  UNUSUAL RASH Items with * indicate a potential emergency and should be followed up as soon as possible.  Feel free to call the clinic should you have any questions or concerns. The clinic phone number is [phone].  Please show the [facility] at check-in to the Emergency Department and triage nurse.

## 2017-06-27 NOTE — Telephone Encounter (Signed)
Gave patient avs report and appointments for November, central radiology will call re scan.

## 2017-06-27 NOTE — Progress Notes (Signed)
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  PDL1 expression: 20%.  PRIOR THERAPY: None.  CURRENT THERAPY: A course of concurrent chemoradiation with weekly carboplatin for AUC of 2 and paclitaxel 45 MG/M2. Status post 5 cycles.  INTERVAL HISTORY: Raven Cochran 46 y.o. female returns to the clinic today for follow-up visit accompanied by her husband. The patient is feeling fine today with no specific complaints except for mild dysphagia. She is tolerating this treatment with concurrent chemoradiation fairly well. Unfortunately she continues to smoke and she is working on quitting. She denied having any chest pain, shortness of breath but continues to have cough with no hemoptysis. She denied having any fever or chills. She has no nausea, vomiting, diarrhea or constipation. She is here today for evaluation before starting cycle #6 of her treatment.  MEDICAL HISTORY: Past Medical History:  Diagnosis Date  . Anxiety 06/13/2017  . Encounter for antineoplastic chemotherapy 05/12/2017  . Headache    hx migraines none in 10 yrs  . Lung mass    left hilary mass  . PONV (postoperative nausea and vomiting)   . UTI (urinary tract infection) 9 weeks ago    ALLERGIES:  has No Known Allergies.  MEDICATIONS:  Current Outpatient Prescriptions  Medication Sig Dispense Refill  . acetaminophen (TYLENOL) 500 MG tablet Take 500 mg by mouth every 6 (six) hours as needed for moderate pain.    Marland Kitchen ALPRAZolam (XANAX) 0.5 MG tablet Take 1 tablet (0.5 mg total) by mouth 3 (three) times daily as needed for anxiety. 30 tablet 0  . calcium carbonate  (TUMS - DOSED IN MG ELEMENTAL CALCIUM) 500 MG chewable tablet Chew 1 tablet by mouth 3 (three) times daily.    Marland Kitchen esomeprazole (NEXIUM) 20 MG capsule Take 40 mg by mouth daily at 12 noon.    Marland Kitchen levonorgestrel (MIRENA) 20 MCG/24HR IUD 1 each by Intrauterine route once. Inserted 8 yrs ago    . lidocaine-prilocaine (EMLA) cream Apply 1 application topically as needed. 30 g 0  . magic mouthwash w/lidocaine SOLN Take 10 mLs by mouth 4 (four) times daily as needed for mouth pain. 480 mL 1  . naproxen sodium (ANAPROX) 220 MG tablet Take 440 mg by mouth daily as needed (pain).    . ondansetron (ZOFRAN) 8 MG tablet Take 1 tablet (8 mg total) by mouth every 8 (eight) hours as needed for nausea or vomiting. 20 tablet 0  . prochlorperazine (COMPAZINE) 10 MG tablet Take 1 tablet (10 mg total) by mouth every 6 (six) hours as needed for nausea or vomiting. 30 tablet 1  . Wound Dressings (SONAFINE EX) Apply topically.     No current facility-administered medications for this visit.     SURGICAL HISTORY:  Past Surgical History:  Procedure Laterality Date  . BREAST SURGERY Bilateral 2000   augmentation  . CESAREAN SECTION  2006   x 1   . ENDOBRONCHIAL ULTRASOUND Bilateral 05/03/2017   Procedure: ENDOBRONCHIAL ULTRASOUND;  Surgeon: Rigoberto Noel, MD;  Location: [facility];  Service: Cardiopulmonary;  Laterality: Bilateral;  . IR FLUORO GUIDE PORT INSERTION RIGHT  05/19/2017  . IR US GUIDE VASC ACCESS RIGHT  05/19/2017    REVIEW OF SYSTEMS:  A comprehensive review of systems was negative except for: Constitutional: positive for fatigue Gastrointestinal: positive for odynophagia Behavioral/Psych: positive for anxiety   PHYSICAL EXAMINATION: General appearance: alert, cooperative, fatigued and no distress Head: Normocephalic, without obvious abnormality, atraumatic Neck: no adenopathy, no JVD, supple, symmetrical, trachea midline and thyroid not enlarged, symmetric, no tenderness/mass/nodules Lymph nodes:  Cervical, supraclavicular, and axillary nodes normal. Resp: clear to auscultation bilaterally Back: symmetric, no curvature. ROM normal. No CVA tenderness. Cardio: regular rate and rhythm, S1, S2 normal, no murmur, click, rub or gallop GI: soft, non-tender; bowel sounds normal; no masses,  no organomegaly Extremities: extremities normal, atraumatic, no cyanosis or edema  ECOG PERFORMANCE STATUS: 1 - Symptomatic but completely ambulatory  Blood pressure (!) 141/77, pulse 99, temperature 98.6 F (37 C), temperature source Oral, resp. rate 18, height 5' 4"  (1.626 m), weight 150 lb 6.4 oz (68.2 kg), SpO2 100 %.  LABORATORY DATA: Lab Results  Component Value Date   WBC 3.3 (L) 06/27/2017   HGB 11.2 (L) 06/27/2017   HCT 33.0 (L) 06/27/2017   MCV 89.6 06/27/2017   PLT 164 06/27/2017      Chemistry      Component Value Date/Time   NA 133 (L) 06/27/2017 0926   K 3.7 06/27/2017 0926   CL 97 (L) 05/19/2017 1029   CO2 24 06/27/2017 0926   BUN 7.5 06/27/2017 0926   CREATININE 0.7 06/27/2017 0926      Component Value Date/Time   CALCIUM 9.7 06/27/2017 0926   ALKPHOS 129 06/27/2017 0926   AST 11 06/27/2017 0926   ALT 11 06/27/2017 0926   BILITOT 0.68 06/27/2017 0926       RADIOGRAPHIC STUDIES: No results found.  ASSESSMENT AND PLAN: this is a very pleasant 46 years old white female with a stage IIIa non-small cell lung cancer, squamous cell carcinoma with PDL 1 expression of 20% The patient is currently undergoing a course of concurrent chemoradiation with weekly carboplatin and paclitaxel status post 5 cycles. She is tolerating this treatment well except for mild odynophagia. She is currently on Carafate. I recommended for the patient to proceed with cycle #6 today. I will see her back for follow-up visit in 4 weeks for evaluation after repeating CT scan of the chest for restaging of her disease. For smoke cessation, I strongly encouraged the patient to quit smoking and offered  her to smoke cessation program. She was advised to call immediately if she has any concerning symptoms in the interval. The patient voices understanding of current disease status and treatment options and is in agreement with the current care plan. All questions were answered. The patient knows to call the clinic with any problems, questions or concerns. We can certainly see the patient much sooner if necessary.  I spent 10 minutes counseling the patient face to face. The total time spent in the appointment was 15 minutes.  Disclaimer: This note was dictated with voice recognition software. Similar sounding words can inadvertently be transcribed and may not be corrected upon review.

## 2017-06-27 NOTE — Patient Instructions (Signed)
Steps to Quit Smoking Smoking tobacco can be bad for your health. It can also affect almost every organ in your body. Smoking puts you and people around you at risk for many serious long-lasting (chronic) diseases. Quitting smoking is hard, but it is one of the best things that you can do for your health. It is never too late to quit. What are the benefits of quitting smoking? When you quit smoking, you lower your risk for getting serious diseases and conditions. They can include:  Lung cancer or lung disease.  Heart disease.  Stroke.  Heart attack.  Not being able to have children (infertility).  Weak bones (osteoporosis) and broken bones (fractures).  If you have coughing, wheezing, and shortness of breath, those symptoms may get better when you quit. You may also get sick less often. If you are pregnant, quitting smoking can help to lower your chances of having a baby of low birth weight. What can I do to help me quit smoking? Talk with your doctor about what can help you quit smoking. Some things you can do (strategies) include:  Quitting smoking totally, instead of slowly cutting back how much you smoke over a period of time.  Going to in-person counseling. You are more likely to quit if you go to many counseling sessions.  Using resources and support systems, such as: ? Online chats with a counselor. ? Phone quitlines. ? Printed self-help materials. ? Support groups or group counseling. ? Text messaging programs. ? Mobile phone apps or applications.  Taking medicines. Some of these medicines may have nicotine in them. If you are pregnant or breastfeeding, do not take any medicines to quit smoking unless your doctor says it is okay. Talk with your doctor about counseling or other things that can help you.  Talk with your doctor about using more than one strategy at the same time, such as taking medicines while you are also going to in-person counseling. This can help make  quitting easier. What things can I do to make it easier to quit? Quitting smoking might feel very hard at first, but there is a lot that you can do to make it easier. Take these steps:  Talk to your family and friends. Ask them to support and encourage you.  Call phone quitlines, reach out to support groups, or work with a counselor.  Ask people who smoke to not smoke around you.  Avoid places that make you want (trigger) to smoke, such as: ? Bars. ? Parties. ? Smoke-break areas at work.  Spend time with people who do not smoke.  Lower the stress in your life. Stress can make you want to smoke. Try these things to help your stress: ? Getting regular exercise. ? Deep-breathing exercises. ? Yoga. ? Meditating. ? Doing a body scan. To do this, close your eyes, focus on one area of your body at a time from head to toe, and notice which parts of your body are tense. Try to relax the muscles in those areas.  Download or buy apps on your mobile phone or tablet that can help you stick to your quit plan. There are many free apps, such as QuitGuide from the CDC (Centers for Disease Control and Prevention). You can find more support from smokefree.gov and other websites.  This information is not intended to replace advice given to you by your health care provider. Make sure you discuss any questions you have with your health care provider. Document Released: 06/12/2009 Document   Revised: 04/13/2016 Document Reviewed: 12/31/2014 Elsevier Interactive Patient Education  2018 Elsevier Inc.  

## 2017-06-28 ENCOUNTER — Ambulatory Visit
Admission: RE | Admit: 2017-06-28 | Discharge: 2017-06-28 | Disposition: A | Discharge status: Home / Self Care | Payer: Medicaid Other | Source: Ambulatory Visit | Attending: Radiation Oncology | Admitting: Radiation Oncology

## 2017-06-28 DIAGNOSIS — C3492 Malignant neoplasm of unspecified part of left bronchus or lung: Secondary | ICD-10-CM

## 2017-06-28 DIAGNOSIS — Z510 Encounter for antineoplastic radiation therapy: Secondary | ICD-10-CM | POA: Diagnosis not present

## 2017-06-28 MED ORDER — SONAFINE EX EMUL
1.0000 "application " | Freq: Once | CUTANEOUS | Status: AC
  Administered 2017-06-28: 1 via TOPICAL

## 2017-06-29 ENCOUNTER — Ambulatory Visit
Admission: RE | Admit: 2017-06-29 | Discharge: 2017-06-29 | Disposition: A | Discharge status: Home / Self Care | Payer: Medicaid Other | Source: Ambulatory Visit | Attending: Radiation Oncology | Admitting: Radiation Oncology

## 2017-06-29 ENCOUNTER — Ambulatory Visit: Payer: Medicaid Other

## 2017-06-29 DIAGNOSIS — Z510 Encounter for antineoplastic radiation therapy: Secondary | ICD-10-CM | POA: Diagnosis not present

## 2017-06-30 ENCOUNTER — Encounter: Payer: Self-pay | Admitting: Radiation Oncology

## 2017-06-30 ENCOUNTER — Ambulatory Visit: Payer: Medicaid Other

## 2017-06-30 NOTE — Progress Notes (Signed)
  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  Indication for treatment:  Curative      Radiation treatment dates:   05/19/2017 - 06/29/2017  Site/dose:   Left lung / 60 Gy and 30 fx and fractional dose is 2.0 Gy  Beams/energy:  Photon / 6X,10X   Narrative: The patient tolerated radiation treatment relatively well. The patient's cough remained persistent and clear from beginning of treatment to the end. Towards the end of treatment the patient complained of chest pain, heartburn, and trouble swallowing. She was given medication for all these issues. Overall her breathing improved during the course of treatment.  Plan: The patient has completed radiation treatment. The patient will return to radiation oncology clinic for routine followup in one month. I advised them to call or return sooner if they have any questions or concerns related to their recovery or treatment.  -----------------------------------  Blair Promise, PhD, MD  This document serves as a record of services personally performed by Gery Pray, MD. It was created on his behalf by Valeta Harms, a trained medical scribe. The creation of this record is based on the scribe's personal observations and the provider's statements to them. This document has been checked and approved by the attending provider.

## 2017-07-01 ENCOUNTER — Ambulatory Visit: Payer: Medicaid Other

## 2017-07-04 ENCOUNTER — Other Ambulatory Visit: Payer: Self-pay

## 2017-07-04 ENCOUNTER — Ambulatory Visit: Payer: Self-pay

## 2017-07-04 ENCOUNTER — Ambulatory Visit: Payer: Medicaid Other

## 2017-07-05 ENCOUNTER — Ambulatory Visit: Payer: Medicaid Other

## 2017-07-06 ENCOUNTER — Telehealth: Payer: Self-pay | Admitting: Oncology

## 2017-07-06 NOTE — Telephone Encounter (Signed)
Patient called and said she has not been able to eat or drink for the past 5 days.  She said she has burning in her chest when she eats.  She is taking magic mouthwash which helps a little.  She said she is still loosing weight and would like to see Dr. Sondra Come.  Appointment scheduled for 11:30 on 07/07/17.

## 2017-07-07 ENCOUNTER — Encounter: Payer: Self-pay | Admitting: Oncology

## 2017-07-07 ENCOUNTER — Ambulatory Visit
Admission: RE | Admit: 2017-07-07 | Discharge: 2017-07-07 | Disposition: A | Discharge status: Home / Self Care | Payer: Medicaid Other | Source: Ambulatory Visit | Attending: Radiation Oncology | Admitting: Radiation Oncology

## 2017-07-07 ENCOUNTER — Ambulatory Visit: Payer: Self-pay

## 2017-07-07 ENCOUNTER — Ambulatory Visit: Payer: Medicaid Other

## 2017-07-07 ENCOUNTER — Other Ambulatory Visit: Payer: Self-pay | Admitting: Oncology

## 2017-07-07 VITALS — BP 127/85 | HR 111 | Temp 98.0°F | Ht 64.0 in | Wt 138.8 lb

## 2017-07-07 DIAGNOSIS — Z510 Encounter for antineoplastic radiation therapy: Secondary | ICD-10-CM | POA: Diagnosis not present

## 2017-07-07 DIAGNOSIS — C3492 Malignant neoplasm of unspecified part of left bronchus or lung: Secondary | ICD-10-CM

## 2017-07-07 HISTORY — DX: Personal history of irradiation: Z92.3

## 2017-07-07 MED ORDER — LIDOCAINE VISCOUS 2 % MT SOLN: 15.0000 mL | OROMUCOSAL | 0 refills | Status: AC | PRN

## 2017-07-07 MED ORDER — ALPRAZOLAM 0.5 MG PO TABS: 0.5000 mg | ORAL_TABLET | Freq: Three times a day (TID) | ORAL | 0 refills | Status: DC | PRN

## 2017-07-07 MED ORDER — OXYCODONE-ACETAMINOPHEN 5-325 MG PO TABS: 1.0000 | ORAL_TABLET | ORAL | 0 refills | Status: DC | PRN

## 2017-07-07 NOTE — Progress Notes (Signed)
Radiation Oncology         [phone] ________________________________  Name: Raven Cochran MRN: [id number]  Date: 07/07/2017  DOB: August 24, 1971  Follow-Up Visit Note  CC: Sheldon Silvan., MD  Rigoberto Noel, MD    ICD-10-CM   1. 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    Interval Since Last Radiation:  1 weeks  Narrative:  The patient returns today for follow-up earlier than expected. The patient's had significant esophageal symptoms with difficulty swallowing and pain with swallowing. She also feels somewhat dizzy with standing. She denies any chills or fever. She denies any hemoptysis or difficulty breathing.                              ALLERGIES:  has No Known Allergies.  Meds: Current Outpatient Medications  Medication Sig Dispense Refill  . acetaminophen (TYLENOL) 500 MG tablet Take 500 mg by mouth every 6 (six) hours as needed for moderate pain.    Marland Kitchen ALPRAZolam (XANAX) 0.5 MG tablet Take 1 tablet (0.5 mg total) 3 (three) times daily as needed by mouth for anxiety. 30 tablet 0  . calcium carbonate (TUMS - DOSED IN MG ELEMENTAL CALCIUM) 500 MG chewable tablet Chew 1 tablet by mouth 3 (three) times daily.    Marland Kitchen esomeprazole (NEXIUM) 20 MG capsule Take 40 mg by mouth daily at 12 noon.    Marland Kitchen levonorgestrel (MIRENA) 20 MCG/24HR IUD 1 each by Intrauterine route once. Inserted 8 yrs ago    . lidocaine-prilocaine (EMLA) cream Apply 1 application topically as needed. 30 g 0  . magic mouthwash w/lidocaine SOLN Take 10 mLs by mouth 4 (four) times daily as needed for mouth pain. 480 mL 1  . ondansetron (ZOFRAN) 8 MG tablet Take 1 tablet (8 mg total) by mouth every 8 (eight) hours as needed for nausea or vomiting. 20 tablet 0  . Wound Dressings (SONAFINE EX) Apply topically.    .  lidocaine (XYLOCAINE) 2 % solution Use as directed 15 mLs as needed in the mouth or throat for mouth pain. 100 mL 0  . naproxen sodium (ANAPROX) 220 MG tablet Take 440 mg by mouth daily as needed (pain).    Marland Kitchen oxyCODONE-acetaminophen (PERCOCET/ROXICET) 5-325 MG tablet Take 1 tablet every 4 (four) hours as needed by mouth for severe pain. 30 tablet 0  . prochlorperazine (COMPAZINE) 10 MG tablet Take 1 tablet (10 mg total) by mouth every 6 (six) hours as needed for nausea or vomiting. (Patient not taking: Reported on 07/07/2017) 30 tablet 1   No current facility-administered medications for this encounter.     Physical Findings: The patient is in no acute distress. Patient is alert and oriented.  height is 5\' 4"  (1.626 m) and weight is 138 lb 12.8 oz (63 kg). Her oral temperature is 98 F (36.7 C). Her blood pressure is 127/85 and her pulse is 111 (abnormal). Her oxygen saturation is 100%. .  Lungs are clear to auscultation bilaterally. Heart has regular rate and rhythm. No palpable cervical, supraclavicular, or axillary adenopathy. Abdomen soft, non-tender, normal bowel sounds. Patient has dry desquamation particularly along the back region and some erythema but no moist desquamation.  No signs of thrush in the oral cavity   Lab  Findings: Lab Results  Component Value Date   WBC 3.3 (L) 06/27/2017   HGB 11.2 (L) 06/27/2017   HCT 33.0 (L) 06/27/2017   MCV 89.6 06/27/2017   PLT 164 06/27/2017    Radiographic Findings: No results found.  Impression:  The patient is recovering from the effects of radiation.  She is having significant radiation esophagitis. Been given a prescription for Percocet Viscous Xylocaine. She had refill on her Xanax.  Plan:  Prescriptions as above. The patient will return tomorrow for IV fluid supplementation. She will return next week for close follow-up  ____________________________________ Gery Pray, MD

## 2017-07-07 NOTE — Progress Notes (Signed)
Raven Cochran is here for follow up after treatment to her left lung.  She reports having pain at a 8/10 in her central chest with swallowing.  She is taking magic mouthwash before meals and said it helps for a few minutes where she is able to to get in a few bites of food.  She said she is hungry which is keeping her up at night.  She is taking xanax which helps and needs a refill.  She said she feels dizzy occasionally with standing.  Orthostatic vitals: bp sitting 138/88, hr 113, bp standing 127/85, hr 111.  She reports having shortness of breath from the pain.  She is coughing with yellow/white sputum.  The skin on her left upper back is red and peeling.  She is using sonafine.  She reports having fatigue in the evenings.    BP 127/85 (BP Location: Left Arm, Patient Position: Standing)   Pulse (!) 111   Temp 98 F (36.7 C) (Oral)   Ht 5\' 4"  (1.626 m)   Wt 138 lb 12.8 oz (63 kg)   SpO2 100%   BMI 23.82 kg/m    Wt Readings from Last 3 Encounters:  07/07/17 138 lb 12.8 oz (63 kg)  06/27/17 150 lb 6.4 oz (68.2 kg)  06/13/17 153 lb 3.2 oz (69.5 kg)

## 2017-07-08 ENCOUNTER — Encounter (HOSPITAL_COMMUNITY): Payer: Self-pay

## 2017-07-11 ENCOUNTER — Ambulatory Visit (HOSPITAL_COMMUNITY)
Admission: RE | Admit: 2017-07-11 | Discharge: 2017-07-11 | Disposition: A | Discharge status: Home / Self Care | Payer: Medicaid Other | Source: Ambulatory Visit | Attending: Internal Medicine | Admitting: Internal Medicine

## 2017-07-11 DIAGNOSIS — C3492 Malignant neoplasm of unspecified part of left bronchus or lung: Secondary | ICD-10-CM | POA: Diagnosis present

## 2017-07-11 MED ORDER — SODIUM CHLORIDE 0.9% FLUSH
10.0000 mL | INTRAVENOUS | Status: AC | PRN
  Administered 2017-07-11: 10 mL

## 2017-07-11 MED ORDER — HEPARIN SOD (PORK) LOCK FLUSH 100 UNIT/ML IV SOLN
500.0000 [IU] | INTRAVENOUS | Status: AC | PRN
  Administered 2017-07-11: 500 [IU]
  Filled 2017-07-11: qty 5

## 2017-07-11 MED ORDER — SODIUM CHLORIDE 0.9 % IV SOLN
INTRAVENOUS | Status: AC
  Administered 2017-07-11: 11:00:00 via INTRAVENOUS

## 2017-07-11 NOTE — Discharge Instructions (Signed)
Patient received 1 L bolus of Normal Saline over 2 hours.

## 2017-07-11 NOTE — Progress Notes (Addendum)
PATIENT CARE CENTER NOTE  Diagnosis: Dehydration    Provider: Melina Modena   Procedure: 1 Liter bolus of Normal Saline   Note: Patient received 1 Liter bag of Normal Saline IV. Patient tolerated well. Discharge information given. Patient alert, oriented and ambulatory upon discharge.

## 2017-07-13 ENCOUNTER — Encounter: Payer: Self-pay | Admitting: Radiation Oncology

## 2017-07-14 ENCOUNTER — Ambulatory Visit
Admission: RE | Admit: 2017-07-14 | Discharge: 2017-07-14 | Disposition: A | Discharge status: Home / Self Care | Payer: Medicaid Other | Source: Ambulatory Visit | Attending: Radiation Oncology | Admitting: Radiation Oncology

## 2017-07-15 ENCOUNTER — Telehealth: Payer: Self-pay | Admitting: Medical Oncology

## 2017-07-15 NOTE — Telephone Encounter (Signed)
Today I received on call note ( dated 11/15) that pt called to cancel her appt for 11/15.

## 2017-07-22 ENCOUNTER — Telehealth: Payer: Self-pay | Admitting: *Deleted

## 2017-07-22 ENCOUNTER — Ambulatory Visit (HOSPITAL_BASED_OUTPATIENT_CLINIC_OR_DEPARTMENT_OTHER): Payer: Medicaid Other

## 2017-07-22 ENCOUNTER — Other Ambulatory Visit (HOSPITAL_BASED_OUTPATIENT_CLINIC_OR_DEPARTMENT_OTHER): Payer: Medicaid Other

## 2017-07-22 DIAGNOSIS — C3412 Malignant neoplasm of upper lobe, left bronchus or lung: Secondary | ICD-10-CM

## 2017-07-22 DIAGNOSIS — Z95828 Presence of other vascular implants and grafts: Secondary | ICD-10-CM

## 2017-07-22 DIAGNOSIS — C3492 Malignant neoplasm of unspecified part of left bronchus or lung: Secondary | ICD-10-CM

## 2017-07-22 DIAGNOSIS — Z5111 Encounter for antineoplastic chemotherapy: Secondary | ICD-10-CM

## 2017-07-22 LAB — COMPREHENSIVE METABOLIC PANEL
ALBUMIN: 2.7 g/dL — AB (ref 3.5–5.0)
ALK PHOS: 117 U/L (ref 40–150)
ALT: 7 U/L (ref 0–55)
ANION GAP: 11 meq/L (ref 3–11)
AST: 10 U/L (ref 5–34)
BUN: 4.2 mg/dL — ABNORMAL LOW (ref 7.0–26.0)
CALCIUM: 9.3 mg/dL (ref 8.4–10.4)
CO2: 24 mEq/L (ref 22–29)
Chloride: 96 mEq/L — ABNORMAL LOW (ref 98–109)
Creatinine: 0.6 mg/dL (ref 0.6–1.1)
Glucose: 97 mg/dl (ref 70–140)
POTASSIUM: 3.7 meq/L (ref 3.5–5.1)
SODIUM: 131 meq/L — AB (ref 136–145)
Total Bilirubin: 0.86 mg/dL (ref 0.20–1.20)
Total Protein: 6.4 g/dL (ref 6.4–8.3)

## 2017-07-22 LAB — CBC WITH DIFFERENTIAL/PLATELET
BASO%: 0.4 % (ref 0.0–2.0)
Basophils Absolute: 0 10*3/uL (ref 0.0–0.1)
EOS%: 0.2 % (ref 0.0–7.0)
Eosinophils Absolute: 0 10*3/uL (ref 0.0–0.5)
HEMATOCRIT: 31.3 % — AB (ref 34.8–46.6)
HGB: 10.5 g/dL — ABNORMAL LOW (ref 11.6–15.9)
LYMPH#: 0.4 10*3/uL — AB (ref 0.9–3.3)
LYMPH%: 5.7 % — ABNORMAL LOW (ref 14.0–49.7)
MCH: 30.4 pg (ref 25.1–34.0)
MCHC: 33.6 g/dL (ref 31.5–36.0)
MCV: 90.5 fL (ref 79.5–101.0)
MONO#: 0.7 10*3/uL (ref 0.1–0.9)
MONO%: 9.7 % (ref 0.0–14.0)
NEUT%: 84 % — ABNORMAL HIGH (ref 38.4–76.8)
NEUTROS ABS: 6.5 10*3/uL (ref 1.5–6.5)
PLATELETS: 326 10*3/uL (ref 145–400)
RBC: 3.46 10*6/uL — ABNORMAL LOW (ref 3.70–5.45)
RDW: 20 % — AB (ref 11.2–14.5)
WBC: 7.7 10*3/uL (ref 3.9–10.3)

## 2017-07-22 MED ORDER — SODIUM CHLORIDE 0.9% FLUSH
10.0000 mL | INTRAVENOUS | Status: DC | PRN
Start: 2017-07-22 — End: 2017-07-22
  Administered 2017-07-22: 10 mL via INTRAVENOUS
  Filled 2017-07-22: qty 10

## 2017-07-22 MED ORDER — HEPARIN SOD (PORK) LOCK FLUSH 100 UNIT/ML IV SOLN
500.0000 [IU] | Freq: Once | INTRAVENOUS | Status: AC | PRN
  Administered 2017-07-22: 500 [IU] via INTRAVENOUS
  Filled 2017-07-22: qty 5

## 2017-07-26 ENCOUNTER — Telehealth: Payer: Self-pay | Admitting: Internal Medicine

## 2017-07-26 ENCOUNTER — Ambulatory Visit (HOSPITAL_BASED_OUTPATIENT_CLINIC_OR_DEPARTMENT_OTHER): Payer: Medicaid Other | Admitting: Internal Medicine

## 2017-07-26 ENCOUNTER — Encounter: Payer: Self-pay | Admitting: Internal Medicine

## 2017-07-26 VITALS — BP 104/51 | HR 116 | Temp 98.4°F | Resp 18 | Ht 64.0 in | Wt 140.2 lb

## 2017-07-26 DIAGNOSIS — R52 Pain, unspecified: Secondary | ICD-10-CM | POA: Diagnosis not present

## 2017-07-26 DIAGNOSIS — C3412 Malignant neoplasm of upper lobe, left bronchus or lung: Secondary | ICD-10-CM

## 2017-07-26 DIAGNOSIS — C3492 Malignant neoplasm of unspecified part of left bronchus or lung: Secondary | ICD-10-CM

## 2017-07-26 DIAGNOSIS — Z5111 Encounter for antineoplastic chemotherapy: Secondary | ICD-10-CM

## 2017-07-26 MED ORDER — OXYCODONE-ACETAMINOPHEN 5-325 MG PO TABS: 1.0000 | ORAL_TABLET | ORAL | 0 refills | Status: DC | PRN

## 2017-07-26 NOTE — Telephone Encounter (Signed)
Scheduled appt per 11/27 los - left message on patient voicemail with appt date and time.

## 2017-07-26 NOTE — Progress Notes (Signed)
[place] [phone]   [phone]) [phone]  OFFICE PROGRESS NOTE  Sheldon Silvan., MD [address] 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  PDL1 expression: 20%.  PRIOR THERAPY: A course of concurrent chemoradiation with weekly carboplatin for AUC of 2 and paclitaxel 45 MG/M2. Status post 6 cycles.  Last dose was given June 27, 2017.  CURRENT THERAPY: None  INTERVAL HISTORY: Raven Cochran 46 y.o. female returns to the clinic today for follow-up visit accompanied by her mother.  The patient is feeling fine today with no specific complaints except for the persistent sternal chest pain.  She is currently on Percocet and she is requesting refill of this medication.  She continues to have mild odynophagia.  She denied having any significant weight loss or night sweats.  She has no nausea, vomiting, diarrhea or constipation.  She has no significant shortness of breath or hemoptysis.  She was supposed to have repeat CT scan of the chest before this visit but unfortunately she missed her appointment for the scan.  She is here today for evaluation question of her treatment options.  MEDICAL HISTORY: Past Medical History:  Diagnosis Date  . Anxiety 06/13/2017  . Encounter for antineoplastic chemotherapy 05/12/2017  . Headache    hx migraines none in 10 yrs  . History of radiation therapy 05/19/17-06/29/17   left lung 60 Gy in 30 fractions  . Lung mass    left hilary mass  . PONV (postoperative nausea and vomiting)   . UTI (urinary tract infection) 9 weeks ago    ALLERGIES:  has No Known Allergies.  MEDICATIONS:  Current Outpatient Medications  Medication Sig Dispense Refill  . acetaminophen (TYLENOL) 500 MG tablet Take  500 mg by mouth every 6 (six) hours as needed for moderate pain.    Marland Kitchen ALPRAZolam (XANAX) 0.5 MG tablet Take 1 tablet (0.5 mg total) 3 (three) times daily as needed by mouth for anxiety. 30 tablet 0  . calcium carbonate (TUMS - DOSED IN MG ELEMENTAL CALCIUM) 500 MG chewable tablet Chew 1 tablet by mouth 3 (three) times daily.    Marland Kitchen esomeprazole (NEXIUM) 20 MG capsule Take 40 mg by mouth daily at 12 noon.    Marland Kitchen levonorgestrel (MIRENA) 20 MCG/24HR IUD 1 each by Intrauterine route once. Inserted 8 yrs ago    . lidocaine (XYLOCAINE) 2 % solution Use as directed 15 mLs as needed in the mouth or throat for mouth pain. 100 mL 0  . lidocaine-prilocaine (EMLA) cream Apply 1 application topically as needed. 30 g 0  . magic mouthwash w/lidocaine SOLN Take 10 mLs by mouth 4 (four) times daily as needed for mouth pain. 480 mL 1  . naproxen sodium (ANAPROX) 220 MG tablet Take 440 mg by mouth daily as needed (pain).    . ondansetron (ZOFRAN) 8 MG tablet Take 1 tablet (8 mg total) by mouth every 8 (eight) hours as needed for nausea or vomiting. 20 tablet 0  . oxyCODONE-acetaminophen (PERCOCET/ROXICET) 5-325 MG tablet Take 1 tablet every 4 (four) hours as needed by mouth for severe pain. 30 tablet 0  . prochlorperazine (COMPAZINE) 10 MG tablet Take 1 tablet (10 mg total) by mouth every 6 (six) hours as needed for nausea or vomiting. (Patient not taking:  Reported on 07/07/2017) 30 tablet 1  . Wound Dressings (SONAFINE EX) Apply topically.     No current facility-administered medications for this visit.     SURGICAL HISTORY:  Past Surgical History:  Procedure Laterality Date  . BREAST SURGERY Bilateral 2000   augmentation  . CESAREAN SECTION  2006   x 1   . ENDOBRONCHIAL ULTRASOUND Bilateral 05/03/2017   Procedure: ENDOBRONCHIAL ULTRASOUND;  Surgeon: Rigoberto Noel, MD;  Location: [facility];  Service: Cardiopulmonary;  Laterality: Bilateral;  . IR FLUORO GUIDE PORT INSERTION RIGHT  05/19/2017  . IR US GUIDE VASC  ACCESS RIGHT  05/19/2017    REVIEW OF SYSTEMS:  A comprehensive review of systems was negative except for: Constitutional: positive for fatigue Gastrointestinal: positive for odynophagia   PHYSICAL EXAMINATION: General appearance: alert, cooperative and no distress Head: Normocephalic, without obvious abnormality, atraumatic Neck: no adenopathy, no JVD, supple, symmetrical, trachea midline and thyroid not enlarged, symmetric, no tenderness/mass/nodules Lymph nodes: Cervical, supraclavicular, and axillary nodes normal. Resp: clear to auscultation bilaterally Back: symmetric, no curvature. ROM normal. No CVA tenderness. Cardio: regular rate and rhythm, S1, S2 normal, no murmur, click, rub or gallop GI: soft, non-tender; bowel sounds normal; no masses,  no organomegaly Extremities: extremities normal, atraumatic, no cyanosis or edema  ECOG PERFORMANCE STATUS: 1 - Symptomatic but completely ambulatory  Blood pressure (!) 104/51, pulse (!) 116, temperature 98.4 F (36.9 C), temperature source Oral, resp. rate 18, height _0  (1.626 m), weight 140 lb 3.2 oz (63.6 kg), SpO2 97 %.  LABORATORY DATA: Lab Results  Component Value Date   WBC 7.7 07/22/2017   HGB 10.5 (L) 07/22/2017   HCT 31.3 (L) 07/22/2017   MCV 90.5 07/22/2017   PLT 326 07/22/2017      Chemistry      Component Value Date/Time   NA 131 (L) 07/22/2017 1204   K 3.7 07/22/2017 1204   CL 97 (L) 05/19/2017 1029   CO2 24 07/22/2017 1204   BUN 4.2 (L) 07/22/2017 1204   CREATININE 0.6 07/22/2017 1204      Component Value Date/Time   CALCIUM 9.3 07/22/2017 1204   ALKPHOS 117 07/22/2017 1204   AST 10 07/22/2017 1204   ALT 7 07/22/2017 1204   BILITOT 0.86 07/22/2017 1204       RADIOGRAPHIC STUDIES: No results found.  ASSESSMENT AND PLAN: this is a very pleasant 46 years old white female with a stage IIIa non-small cell lung cancer, squamous cell carcinoma with PDL 1 expression of 20% The patient is currently  undergoing a course of concurrent chemoradiation with weekly carboplatin and paclitaxel status post 6 cycles.  She tolerated the previous course of her treatment well except for odynophagia treated with Carafate and viscous lidocaine as well as pain medications. The patient was supposed to have repeat CT scan of the chest before this visit but she missed her appointment.  I will arrange for her to have repeat CT scan of the chest performed in the next few days.  I will see her back for follow-up visit in 1 week for reevaluation and discussion of her treatment options based on the scan results. For pain management, I give her a refill of Percocet. The patient was advised to call immediately if she has any concerning symptoms in the interval. The patient voices understanding of current disease status and treatment options and is in agreement with the current care plan. All questions were answered. The patient knows to call the clinic with any  problems, questions or concerns. We can certainly see the patient much sooner if necessary.  I spent 10 minutes counseling the patient face to face. The total time spent in the appointment was 15 minutes.  Disclaimer: This note was dictated with voice recognition software. Similar sounding words can inadvertently be transcribed and may not be corrected upon review.

## 2017-07-27 ENCOUNTER — Encounter (HOSPITAL_COMMUNITY): Payer: Self-pay | Admitting: Radiology

## 2017-07-27 ENCOUNTER — Ambulatory Visit (HOSPITAL_COMMUNITY)
Admission: RE | Admit: 2017-07-27 | Discharge: 2017-07-27 | Disposition: A | Discharge status: Home / Self Care | Payer: Medicaid Other | Source: Ambulatory Visit | Attending: Internal Medicine | Admitting: Internal Medicine

## 2017-07-27 DIAGNOSIS — J439 Emphysema, unspecified: Secondary | ICD-10-CM | POA: Insufficient documentation

## 2017-07-27 DIAGNOSIS — C3492 Malignant neoplasm of unspecified part of left bronchus or lung: Secondary | ICD-10-CM

## 2017-07-27 DIAGNOSIS — Z5111 Encounter for antineoplastic chemotherapy: Secondary | ICD-10-CM

## 2017-07-27 DIAGNOSIS — J90 Pleural effusion, not elsewhere classified: Secondary | ICD-10-CM | POA: Diagnosis not present

## 2017-07-27 DIAGNOSIS — I700 Atherosclerosis of aorta: Secondary | ICD-10-CM | POA: Diagnosis not present

## 2017-07-27 DIAGNOSIS — I2510 Atherosclerotic heart disease of native coronary artery without angina pectoris: Secondary | ICD-10-CM | POA: Insufficient documentation

## 2017-07-27 MED ORDER — IOPAMIDOL (ISOVUE-300) INJECTION 61%
INTRAVENOUS | Status: AC
  Administered 2017-07-27: 75 mL via INTRAVENOUS
  Filled 2017-07-27: qty 75

## 2017-07-27 MED ORDER — HEPARIN SOD (PORK) LOCK FLUSH 100 UNIT/ML IV SOLN
INTRAVENOUS | Status: AC
  Filled 2017-07-27: qty 5

## 2017-07-27 MED ORDER — HEPARIN SOD (PORK) LOCK FLUSH 100 UNIT/ML IV SOLN
500.0000 [IU] | Freq: Once | INTRAVENOUS | Status: AC
  Administered 2017-07-27: 500 [IU] via INTRAVENOUS

## 2017-07-28 ENCOUNTER — Encounter: Payer: Self-pay | Admitting: *Deleted

## 2017-07-28 ENCOUNTER — Encounter: Payer: Self-pay | Admitting: Radiation Oncology

## 2017-07-28 ENCOUNTER — Ambulatory Visit
Admission: RE | Admit: 2017-07-28 | Discharge: 2017-07-28 | Disposition: A | Discharge status: Home / Self Care | Payer: Medicaid Other | Source: Ambulatory Visit | Attending: Radiation Oncology | Admitting: Radiation Oncology

## 2017-07-28 DIAGNOSIS — C3492 Malignant neoplasm of unspecified part of left bronchus or lung: Secondary | ICD-10-CM

## 2017-07-28 DIAGNOSIS — Z510 Encounter for antineoplastic radiation therapy: Secondary | ICD-10-CM | POA: Diagnosis not present

## 2017-07-28 MED ORDER — OXYCODONE-ACETAMINOPHEN 5-325 MG PO TABS: 1.0000 | ORAL_TABLET | ORAL | 0 refills | Status: DC | PRN

## 2017-07-28 NOTE — Progress Notes (Signed)
Radiation Oncology         [phone] ________________________________  Name: Raven Cochran MRN: [id number]  Date: 07/28/2017  DOB: 05-07-1971  Follow-Up Visit Note  CC: Sheldon Silvan., MD  Rigoberto Noel, MD    ICD-10-CM   1. 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  Interval Since Last Radiation: 24 days  Narrative:  The patient presents today for a follow up.  Pt presents with her boyfriend and her mom. Pt requests a refill of her prescription of percocet and she is also taking xanax at this time. Pt takes Xyolcaine and she notes that 3 days ago she ate without pain for the first time. She did have her Percocet refill by Dr. Julien Nordmann recently but does not want to run out of this medication.   ALLERGIES:  has No Known Allergies.  Meds: Current Outpatient Medications  Medication Sig Dispense Refill  . ALPRAZolam (XANAX) 0.5 MG tablet Take 1 tablet (0.5 mg total) 3 (three) times daily as needed by mouth for anxiety. 30 tablet 0  . calcium carbonate (TUMS - DOSED IN MG ELEMENTAL CALCIUM) 500 MG chewable tablet Chew 1 tablet by mouth 3 (three) times daily.    Marland Kitchen levonorgestrel (MIRENA) 20 MCG/24HR IUD 1 each by Intrauterine route once. Inserted 8 yrs ago    . lidocaine (XYLOCAINE) 2 % solution Use as directed 15 mLs as needed in the mouth or throat for mouth pain. 100 mL 0  . lidocaine-prilocaine (EMLA) cream Apply 1 application topically as needed. 30 g 0  . Wound Dressings (SONAFINE EX) Apply topically.    Marland Kitchen acetaminophen (TYLENOL) 500 MG tablet Take 500 mg by mouth every 6 (six) hours as needed for moderate pain.    Marland Kitchen esomeprazole (NEXIUM) 20 MG capsule Take 40 mg by mouth daily at 12 noon.    . magic mouthwash w/lidocaine SOLN Take 10 mLs by mouth 4 (four) times daily as needed for mouth pain. (Patient not taking: Reported on 07/28/2017) 480 mL  1  . naproxen sodium (ANAPROX) 220 MG tablet Take 440 mg by mouth daily as needed (pain).    . ondansetron (ZOFRAN) 8 MG tablet Take 1 tablet (8 mg total) by mouth every 8 (eight) hours as needed for nausea or vomiting. (Patient not taking: Reported on 07/28/2017) 20 tablet 0  . oxyCODONE-acetaminophen (PERCOCET/ROXICET) 5-325 MG tablet Take 1 tablet by mouth every 4 (four) hours as needed for severe pain. 30 tablet 0  . prochlorperazine (COMPAZINE) 10 MG tablet Take 1 tablet (10 mg total) by mouth every 6 (six) hours as needed for nausea or vomiting. (Patient not taking: Reported on 07/07/2017) 30 tablet 1   No current facility-administered medications for this encounter.     Physical Findings: The patient is in no acute distress. Patient is alert and oriented.  height is 5\' 4"  (1.626 m) and weight is 139 lb 9.6 oz (63.3 kg). Her oral temperature is 98.3 F (36.8 C). Her blood pressure is 143/98 (abnormal) and her pulse is 81. Her oxygen saturation is 96%. .  Lungs are clear to auscultation bilaterally. Heart has regular rhythm and rate. No palpable cervical, supraclavicular, or axillary adenopathy. Abdomen soft, non-tender, normal bowel sounds.The pt skin has healed well.    Lab Findings: Lab Results  Component Value Date   WBC 7.7 07/22/2017   HGB 10.5 (  L) 07/22/2017   HCT 31.3 (L) 07/22/2017   MCV 90.5 07/22/2017   PLT 326 07/22/2017    Radiographic Findings: Ct Chest W Contrast  Result Date: 07/28/2017 CLINICAL DATA:  Stage IIIA left upper lobe squamous cell lung carcinoma diagnosed September 2018 status post concurrent chemoradiation therapy, completed radiation therapy 06/29/2017. Restaging. EXAM: CT CHEST WITH CONTRAST TECHNIQUE: Multidetector CT imaging of the chest was performed during intravenous contrast administration. CONTRAST:  80mL ISOVUE-300 IOPAMIDOL (ISOVUE-300) INJECTION 61% COMPARISON:  04/26/2017 PET-CT.  04/15/2017 chest CT. FINDINGS: Cardiovascular: Normal heart  size. Trace pericardial effusion/thickening, slightly increased. Three-vessel coronary atherosclerosis. Right internal jugular MediPort terminates at the cavoatrial junction. Atherosclerotic nonaneurysmal thoracic aorta. Normal caliber pulmonary arteries. No central pulmonary emboli. Mediastinum/Nodes: No discrete thyroid nodules. Mild circumferential wall thickening in the midthoracic esophagus appears new. No axillary adenopathy. Enlarged heterogeneously enhancing 2.9 cm AP window node (series 2/ image 53), increased from 2.3 cm on 04/15/2017 chest CT. Enlarged heterogeneous enhancing 2.9 cm left subcarinal node (series 2/ image 66), increased from 2.0 cm. Infiltrative heterogeneous enhancing bulky left hilar adenopathy measuring up to 3.8 cm (series 2/ image 61), previously 3.6 cm on 04/15/2017 using similar measurement technique, mildly increased. Associated mildly increased extrinsic narrowing of the central left airways and central left pulmonary arteries by the adenopathy. No right hilar adenopathy. Lungs/Pleura: No pneumothorax. New trace dependent left pleural effusion. No right pleural effusion. Left upper lobe 1.8 x 1.0 cm solid pulmonary nodule (series 5/ image 62), previously 2.2 x 1.7 cm, decreased. New subsegmental atelectasis in the lingula. New patchy consolidation and tree-in-bud opacity in the superior segment left lower lobe. Mild compressive atelectasis in the dependent left lower lobe. No new significant pulmonary nodules in the right lung. Mild centrilobular emphysema. Upper abdomen: No acute abnormality. Musculoskeletal: No aggressive appearing focal osseous lesions. Mild thoracic spondylosis. Intact appearing bilateral breast prostheses. IMPRESSION: 1. Left upper lobe 1.8 cm solid pulmonary nodule is decreased. 2. Bulky infiltrative left hilar, AP window and subcarinal nodal metastases are increased, with associated worsening extrinsic compression of the central left airways and left  pulmonary arteries. 3. New patchy consolidation and tree-in-bud opacity in the superior segment left lower lobe, favor postobstructive pneumonitis. 4. New trace dependent left pleural effusion. 5. New mild circumferential wall thickening in the midthoracic esophagus, most compatible with post treatment change. 6. Three-vessel coronary atherosclerosis. Aortic Atherosclerosis (ICD10-I70.0) and Emphysema (ICD10-J43.9). Electronically Signed   By: Ilona Sorrel M.D.   On: 07/28/2017 08:43    Impression:  The patient is recovering from the effects of radiation.  She is feeling better but has some swallowing difficulties. She continues to have pain but this overall has improved. Overall her strength has improved. We reviewed recent CT scan and we discussed how she had a mixed response to treatment.  She is having some coughing but does not appear to be worsening in her coughing or reading. We did talk about potential development of radiation pneumonitis requiring steroid therapy.   Plan: Pt will meet with Dr. Julien Nordmann next week for immunotherapy. Pt will have a routine follow up with radiation oncology in 3 months.   -----------------------------------  Blair Promise, PhD, MD This document serves as a record of services personally performed by Gery Pray, MD. It was created on his behalf by Valeta Harms, a trained medical scribe. The creation of this record is based on the scribe's personal observations and the provider's statements to them. This document has been checked and approved by the attending  provider.

## 2017-07-28 NOTE — Progress Notes (Signed)
Oncology Nurse Navigator Documentation  Oncology Nurse Navigator Flowsheets 07/28/2017  Navigator Location [facility]  Navigator Encounter Type Other/placed scheduling message to call and schedule labs prior to appt with Mikey Bussing on 08/02/17.   Treatment Phase Follow-up  Barriers/Navigation Needs Coordination of Care  Interventions Coordination of Care  Coordination of Care Other  Acuity Level 1  Time Spent with Patient 15

## 2017-07-28 NOTE — Progress Notes (Signed)
Raven Cochran is here for follow up after treatment to her left lung.  She reports having pain at a 3/10 in her chest.  She said it feels like something is squeezing her chest.  She is taking 0.5 tablet percocet as needed and needs a refill.  She reports having a more frequent cough with clear sputum.  She denies having hemoptysis.  She reports her swallowing is improving.  She is taking lidocaine as needed.  She reports having fatigue.  The skin on her left chest and upper back is pink and healed.  BP (!) 143/98 (BP Location: Right Arm, Patient Position: Sitting)   Pulse 81   Temp 98.3 F (36.8 C) (Oral)   Ht 5\' 4"  (1.626 m)   Wt 139 lb 9.6 oz (63.3 kg)   SpO2 96%   BMI 23.96 kg/m    Wt Readings from Last 3 Encounters:  07/28/17 139 lb 9.6 oz (63.3 kg)  07/26/17 140 lb 3.2 oz (63.6 kg)  07/07/17 138 lb 12.8 oz (63 kg)

## 2017-07-29 ENCOUNTER — Other Ambulatory Visit: Payer: Self-pay | Admitting: Internal Medicine

## 2017-08-01 ENCOUNTER — Telehealth: Payer: Self-pay | Admitting: Internal Medicine

## 2017-08-01 NOTE — Telephone Encounter (Signed)
Scheduled appt per 11/29 message - left message with appt date and time.

## 2017-08-02 ENCOUNTER — Telehealth: Payer: Self-pay | Admitting: Internal Medicine

## 2017-08-02 ENCOUNTER — Ambulatory Visit (HOSPITAL_BASED_OUTPATIENT_CLINIC_OR_DEPARTMENT_OTHER): Payer: Medicaid Other | Admitting: Oncology

## 2017-08-02 ENCOUNTER — Other Ambulatory Visit (HOSPITAL_BASED_OUTPATIENT_CLINIC_OR_DEPARTMENT_OTHER): Payer: Medicaid Other

## 2017-08-02 ENCOUNTER — Encounter: Payer: Self-pay | Admitting: Oncology

## 2017-08-02 VITALS — BP 123/72 | HR 110 | Temp 98.3°F | Resp 18 | Ht 64.0 in | Wt 140.2 lb

## 2017-08-02 DIAGNOSIS — J984 Other disorders of lung: Secondary | ICD-10-CM

## 2017-08-02 DIAGNOSIS — C3412 Malignant neoplasm of upper lobe, left bronchus or lung: Secondary | ICD-10-CM

## 2017-08-02 DIAGNOSIS — C3492 Malignant neoplasm of unspecified part of left bronchus or lung: Secondary | ICD-10-CM

## 2017-08-02 DIAGNOSIS — Z7189 Other specified counseling: Secondary | ICD-10-CM

## 2017-08-02 DIAGNOSIS — J189 Pneumonia, unspecified organism: Secondary | ICD-10-CM | POA: Diagnosis not present

## 2017-08-02 DIAGNOSIS — Z5111 Encounter for antineoplastic chemotherapy: Secondary | ICD-10-CM

## 2017-08-02 DIAGNOSIS — C3490 Malignant neoplasm of unspecified part of unspecified bronchus or lung: Secondary | ICD-10-CM

## 2017-08-02 LAB — CBC WITH DIFFERENTIAL/PLATELET
BASO%: 0.1 % (ref 0.0–2.0)
BASOS ABS: 0 10*3/uL (ref 0.0–0.1)
EOS ABS: 0.1 10*3/uL (ref 0.0–0.5)
EOS%: 0.5 % (ref 0.0–7.0)
HCT: 29.7 % — ABNORMAL LOW (ref 34.8–46.6)
HEMOGLOBIN: 9.7 g/dL — AB (ref 11.6–15.9)
LYMPH%: 6.4 % — AB (ref 14.0–49.7)
MCH: 30 pg (ref 25.1–34.0)
MCHC: 32.7 g/dL (ref 31.5–36.0)
MCV: 92 fL (ref 79.5–101.0)
MONO#: 0.7 10*3/uL (ref 0.1–0.9)
MONO%: 7.9 % (ref 0.0–14.0)
NEUT#: 7.9 10*3/uL — ABNORMAL HIGH (ref 1.5–6.5)
NEUT%: 85.1 % — ABNORMAL HIGH (ref 38.4–76.8)
PLATELETS: 303 10*3/uL (ref 145–400)
RBC: 3.23 10*6/uL — ABNORMAL LOW (ref 3.70–5.45)
RDW: 18.9 % — AB (ref 11.2–14.5)
WBC: 9.3 10*3/uL (ref 3.9–10.3)
lymph#: 0.6 10*3/uL — ABNORMAL LOW (ref 0.9–3.3)

## 2017-08-02 LAB — COMPREHENSIVE METABOLIC PANEL
ALT: 9 U/L (ref 0–55)
AST: 14 U/L (ref 5–34)
Albumin: 2.4 g/dL — ABNORMAL LOW (ref 3.5–5.0)
Alkaline Phosphatase: 160 U/L — ABNORMAL HIGH (ref 40–150)
Anion Gap: 9 mEq/L (ref 3–11)
BUN: 3.5 mg/dL — AB (ref 7.0–26.0)
CHLORIDE: 97 meq/L — AB (ref 98–109)
CO2: 26 meq/L (ref 22–29)
CREATININE: 0.6 mg/dL (ref 0.6–1.1)
Calcium: 9.1 mg/dL (ref 8.4–10.4)
EGFR: >60 mL/min/{1.73_m2} (ref >60)
Glucose: 98 mg/dl (ref 70–140)
Potassium: 3.9 mEq/L (ref 3.5–5.1)
Sodium: 132 mEq/L — ABNORMAL LOW (ref 136–145)
Total Bilirubin: 0.61 mg/dL (ref 0.20–1.20)
Total Protein: 6.3 g/dL — ABNORMAL LOW (ref 6.4–8.3)

## 2017-08-02 MED ORDER — METHYLPREDNISOLONE 4 MG PO TABS: ORAL_TABLET | ORAL | 0 refills | Status: AC

## 2017-08-02 MED ORDER — LEVOFLOXACIN 500 MG PO TABS: 500.0000 mg | ORAL_TABLET | Freq: Every day | ORAL | 0 refills | Status: AC

## 2017-08-02 NOTE — Telephone Encounter (Signed)
Patient declined avs and calendar  °

## 2017-08-03 DIAGNOSIS — J189 Pneumonia, unspecified organism: Secondary | ICD-10-CM | POA: Insufficient documentation

## 2017-08-03 NOTE — Progress Notes (Signed)
[place] OFFICE PROGRESS NOTE  Sheldon Silvan., MD 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  PDL1 expression: 20%.  PRIOR THERAPY: A course of concurrent chemoradiation with weekly carboplatin for AUC of 2 and paclitaxel 45 MG/M2. Status post 6 cycles.  Last dose was given June 27, 2017.  CURRENT THERAPY: None  INTERVAL HISTORY: Raven Cochran 46 y.o. female returns for routine follow-up visit accompanied by her mother.  Patient is feeling fine today except for increased cough and congestion.  She has been taking Delsym which is not helping much.  The patient denies fevers and chills.  Denies chest pain, shortness of breath, hemoptysis.  Denies nausea, vomiting, constipation, diarrhea.  The patient had a recent CT scan of the chest and is here to discuss the results.  MEDICAL HISTORY: Past Medical History:  Diagnosis Date  . Anxiety 06/13/2017  . Encounter for antineoplastic chemotherapy 05/12/2017  . Headache    hx migraines none in 10 yrs  . History of radiation therapy 05/19/17-06/29/17   left lung 60 Gy in 30 fractions  . Lung mass    left hilary mass  . PONV (postoperative nausea and vomiting)   . UTI (urinary tract infection) 9 weeks ago    ALLERGIES:  has No Known Allergies.  MEDICATIONS:  Current Outpatient Medications  Medication Sig Dispense Refill  . acetaminophen (TYLENOL) 500 MG tablet Take 500 mg by mouth every 6 (six) hours as needed for moderate pain.    Marland Kitchen ALPRAZolam (XANAX) 0.5 MG tablet Take 1 tablet (0.5 mg total) 3 (three) times daily as needed by mouth for anxiety. 30 tablet 0  . calcium carbonate (TUMS - DOSED IN MG ELEMENTAL CALCIUM) 500 MG chewable tablet Chew 1 tablet by mouth 3 (three) times  daily.    Marland Kitchen esomeprazole (NEXIUM) 20 MG capsule Take 40 mg by mouth daily at 12 noon.    Marland Kitchen levofloxacin (LEVAQUIN) 500 MG tablet Take 1 tablet (500 mg total) by mouth daily. 7 tablet 0  . levonorgestrel (MIRENA) 20 MCG/24HR IUD 1 each by Intrauterine route once. Inserted 8 yrs ago    . lidocaine (XYLOCAINE) 2 % solution Use as directed 15 mLs as needed in the mouth or throat for mouth pain. 100 mL 0  . lidocaine-prilocaine (EMLA) cream Apply 1 application topically as needed. 30 g 0  . magic mouthwash w/lidocaine SOLN Take 10 mLs by mouth 4 (four) times daily as needed for mouth pain. (Patient not taking: Reported on 07/28/2017) 480 mL 1  . methylPREDNISolone (MEDROL) 4 MG tablet Take 6 tabs on day 1, 5 tabs on day 2, 4 tabs on day 3, 3 tabs on day 4, 2 tabs on day 5, 1 tab on day 6, and then stop 21 tablet 0  . naproxen sodium (ANAPROX) 220 MG tablet Take 440 mg by mouth daily as needed (pain).    . ondansetron (ZOFRAN) 8 MG tablet Take 1 tablet (8 mg total) by mouth every 8 (eight) hours as needed for nausea or vomiting. (Patient not taking: Reported on 07/28/2017) 20 tablet 0  . oxyCODONE-acetaminophen (PERCOCET/ROXICET) 5-325 MG tablet Take 1 tablet by mouth every 4 (four) hours as needed for severe pain. 30 tablet 0  . prochlorperazine (COMPAZINE) 10 MG tablet Take 1 tablet (10  mg total) by mouth every 6 (six) hours as needed for nausea or vomiting. (Patient not taking: Reported on 07/07/2017) 30 tablet 1  . Wound Dressings (SONAFINE EX) Apply topically.     No current facility-administered medications for this visit.     SURGICAL HISTORY:  Past Surgical History:  Procedure Laterality Date  . BREAST SURGERY Bilateral 2000   augmentation  . CESAREAN SECTION  2006   x 1   . ENDOBRONCHIAL ULTRASOUND Bilateral 05/03/2017   Procedure: ENDOBRONCHIAL ULTRASOUND;  Surgeon: Rigoberto Noel, MD;  Location: [facility];  Service: Cardiopulmonary;  Laterality: Bilateral;  . IR FLUORO GUIDE PORT  INSERTION RIGHT  05/19/2017  . IR US GUIDE VASC ACCESS RIGHT  05/19/2017    REVIEW OF SYSTEMS:   Review of Systems  Constitutional: Negative for appetite change, chills, fatigue, fever and unexpected weight change.  HENT:   Negative for mouth sores, nosebleeds, sore throat and trouble swallowing.   Eyes: Negative for eye problems and icterus.  Respiratory: Negative for hemoptysis, shortness of breath and wheezing.  Positive for cough. Cardiovascular: Negative for chest pain and leg swelling.  Gastrointestinal: Negative for abdominal pain, constipation, diarrhea, nausea and vomiting.  Genitourinary: Negative for bladder incontinence, difficulty urinating, dysuria, frequency and hematuria.   Musculoskeletal: Negative for back pain, gait problem, neck pain and neck stiffness.  Skin: Negative for itching and rash.  Neurological: Negative for dizziness, extremity weakness, gait problem, headaches, light-headedness and seizures.  Hematological: Negative for adenopathy. Does not bruise/bleed easily.  Psychiatric/Behavioral: Negative for confusion, depression and sleep disturbance. The patient is not nervous/anxious.     PHYSICAL EXAMINATION:  Blood pressure 123/72, pulse (!) 110, temperature 98.3 F (36.8 C), temperature source Oral, resp. rate 18, height 5' 4"  (1.626 m), weight 140 lb 3.2 oz (63.6 kg), SpO2 100 %.  ECOG PERFORMANCE STATUS: 1 - Symptomatic but completely ambulatory  Physical Exam  Constitutional: Oriented to person, place, and time and well-developed, well-nourished, and in no distress. No distress.  HENT:  Head: Normocephalic and atraumatic.  Mouth/Throat: Oropharynx is clear and moist. No oropharyngeal exudate.  Eyes: Conjunctivae are normal. Right eye exhibits no discharge. Left eye exhibits no discharge. No scleral icterus.  Neck: Normal range of motion. Neck supple.  Cardiovascular: Normal rate, regular rhythm, normal heart sounds and intact distal pulses.    Pulmonary/Chest: Effort normal. No respiratory distress. No rales. Rhonchi and wheezes noted to the posterior left lower lobe of the lung. Abdominal: Soft. Bowel sounds are normal. Exhibits no distension and no mass. There is no tenderness.  Musculoskeletal: Normal range of motion. Exhibits no edema.  Lymphadenopathy:    No cervical adenopathy.  Neurological: Alert and oriented to person, place, and time. Exhibits normal muscle tone. Gait normal. Coordination normal.  Skin: Skin is warm and dry. No rash noted. Not diaphoretic. No erythema. No pallor.  Psychiatric: Mood, memory and judgment normal.  Vitals reviewed.  LABORATORY DATA: Lab Results  Component Value Date   WBC 9.3 08/02/2017   HGB 9.7 (L) 08/02/2017   HCT 29.7 (L) 08/02/2017   MCV 92.0 08/02/2017   PLT 303 08/02/2017      Chemistry      Component Value Date/Time   NA 132 (L) 08/02/2017 1111   K 3.9 08/02/2017 1111   CL 97 (L) 05/19/2017 1029   CO2 26 08/02/2017 1111   BUN 3.5 (L) 08/02/2017 1111   CREATININE 0.6 08/02/2017 1111      Component Value Date/Time   CALCIUM 9.1  08/02/2017 1111   ALKPHOS 160 (H) 08/02/2017 1111   AST 14 08/02/2017 1111   ALT 9 08/02/2017 1111   BILITOT 0.61 08/02/2017 1111       RADIOGRAPHIC STUDIES:  Ct Chest W Contrast  Result Date: 07/28/2017 CLINICAL DATA:  Stage IIIA left upper lobe squamous cell lung carcinoma diagnosed September 2018 status post concurrent chemoradiation therapy, completed radiation therapy 06/29/2017. Restaging. EXAM: CT CHEST WITH CONTRAST TECHNIQUE: Multidetector CT imaging of the chest was performed during intravenous contrast administration. CONTRAST:  35m ISOVUE-300 IOPAMIDOL (ISOVUE-300) INJECTION 61% COMPARISON:  04/26/2017 PET-CT.  04/15/2017 chest CT. FINDINGS: Cardiovascular: Normal heart size. Trace pericardial effusion/thickening, slightly increased. Three-vessel coronary atherosclerosis. Right internal jugular MediPort terminates at the  cavoatrial junction. Atherosclerotic nonaneurysmal thoracic aorta. Normal caliber pulmonary arteries. No central pulmonary emboli. Mediastinum/Nodes: No discrete thyroid nodules. Mild circumferential wall thickening in the midthoracic esophagus appears new. No axillary adenopathy. Enlarged heterogeneously enhancing 2.9 cm AP window node (series 2/ image 53), increased from 2.3 cm on 04/15/2017 chest CT. Enlarged heterogeneous enhancing 2.9 cm left subcarinal node (series 2/ image 66), increased from 2.0 cm. Infiltrative heterogeneous enhancing bulky left hilar adenopathy measuring up to 3.8 cm (series 2/ image 61), previously 3.6 cm on 04/15/2017 using similar measurement technique, mildly increased. Associated mildly increased extrinsic narrowing of the central left airways and central left pulmonary arteries by the adenopathy. No right hilar adenopathy. Lungs/Pleura: No pneumothorax. New trace dependent left pleural effusion. No right pleural effusion. Left upper lobe 1.8 x 1.0 cm solid pulmonary nodule (series 5/ image 62), previously 2.2 x 1.7 cm, decreased. New subsegmental atelectasis in the lingula. New patchy consolidation and tree-in-bud opacity in the superior segment left lower lobe. Mild compressive atelectasis in the dependent left lower lobe. No new significant pulmonary nodules in the right lung. Mild centrilobular emphysema. Upper abdomen: No acute abnormality. Musculoskeletal: No aggressive appearing focal osseous lesions. Mild thoracic spondylosis. Intact appearing bilateral breast prostheses. IMPRESSION: 1. Left upper lobe 1.8 cm solid pulmonary nodule is decreased. 2. Bulky infiltrative left hilar, AP window and subcarinal nodal metastases are increased, with associated worsening extrinsic compression of the central left airways and left pulmonary arteries. 3. New patchy consolidation and tree-in-bud opacity in the superior segment left lower lobe, favor postobstructive pneumonitis. 4. New trace  dependent left pleural effusion. 5. New mild circumferential wall thickening in the midthoracic esophagus, most compatible with post treatment change. 6. Three-vessel coronary atherosclerosis. Aortic Atherosclerosis (ICD10-I70.0) and Emphysema (ICD10-J43.9). Electronically Signed   By: JIlona SorrelM.D.   On: 07/28/2017 08:43     ASSESSMENT/PLAN:  Stage III squamous [place] This is a very pleasant 46year old white female with a stage IIIa non-small cell lung cancer, squamous cell carcinoma with PDL 1 expression of 20% The patient is currently undergoing a course of concurrent chemoradiation with weekly carboplatin and paclitaxel status post 6 cycles.  She tolerated the previous course of her treatment well except for odynophagia treated with Carafate and viscous lidocaine as well as pain medications.  The patient had a recent CT scan of the chest and is here to discuss the results.  Patient was seen by Dr. MJulien Nordmann  CT scan results and images were reviewed with the patient and her mother.  Explained to the patient that she has improvement in her cancer.  There is an area of consolidation likely related to postobstructive pneumonitis.  We need to treat her with antibiotics and a course of steroids.  A prescription for Levaquin 500 mg daily times 7 days and a prescription for Medrol Dosepak were sent to her local pharmacy.  We will plan to bring her back in approximately 2 weeks for reevaluation and to discuss proceeding with treatment with immunotherapy.  For pain management, she will continue Percocet.  The patient was advised to call immediately if she has any concerning symptoms in the interval. The patient voices understanding of current disease status and treatment options and is in agreement with the current care plan. All questions were answered. The patient knows to call the clinic with any problems, questions or concerns. We can certainly see the patient much sooner  if necessary.  No orders of the defined types were placed in this encounter.   Mikey Bussing, DNP, AGPCNP-BC, AOCNP 08/03/17  ADDENDUM: Hematology/Oncology Attending: I had a face-to-face encounter with the patient.  I recommended her care plan.  This is a very pleasant 46 years old white female with a stage IIIa non-small cell lung cancer, squamous cell carcinoma status post a course of concurrent chemoradiation with weekly carboplatin and paclitaxel.  She tolerated her previous treatment well except for odynophagia currently on treatment with Carafate and viscous lidocaine as well as pain medications. The patient had repeat CT scan of the chest performed recently.  I personally and independently reviewed the scan images and discussed the results and showed the images to the patient and her mother. Her scan showed improvement of the left upper lobe lung nodule but the patient continues to have bulky mediastinal lymphadenopathy.  She also has an inflammatory process in the lung with questionable postobstructive pneumonitis. I had a lengthy discussion with the patient about her condition.  I recommended for her treatment with Levaquin 500 mg p.o. daily for 7 days in addition to Medrol Dosepak for the inflammatory process in her lungs. I will arrange for the patient to come back for follow-up visit in 2 weeks for reevaluation and discussion of consolidation treatment with immunotherapy after improvement of her condition. She was advised to call immediately if she has any concerning symptoms in the interval.  Disclaimer: This note was dictated with voice recognition software. Similar sounding words can inadvertently be transcribed and may be missed upon review. Eilleen Kempf, MD 08/03/17

## 2017-08-03 NOTE — Assessment & Plan Note (Addendum)
This is a very pleasant 46 year old white female with a stage IIIa non-small cell lung cancer, squamous cell carcinoma with PDL 1 expression of 20% The patient is currently undergoing a course of concurrent chemoradiation with weekly carboplatin and paclitaxel status post 6 cycles.  She tolerated the previous course of her treatment well except for odynophagia treated with Carafate and viscous lidocaine as well as pain medications.  The patient had a recent CT scan of the chest and is here to discuss the results.  Patient was seen by Dr. Julien Nordmann.  CT scan results and images were reviewed with the patient and her mother.  Explained to the patient that she has improvement in her cancer.  There is an area of consolidation likely related to postobstructive pneumonitis.  We need to treat her with antibiotics and a course of steroids.  A prescription for Levaquin 500 mg daily times 7 days and a prescription for Medrol Dosepak were sent to her local pharmacy.  We will plan to bring her back in approximately 2 weeks for reevaluation and to discuss proceeding with treatment with immunotherapy.  For pain management, she will continue Percocet.  The patient was advised to call immediately if she has any concerning symptoms in the interval. The patient voices understanding of current disease status and treatment options and is in agreement with the current care plan. All questions were answered. The patient knows to call the clinic with any problems, questions or concerns. We can certainly see the patient much sooner if necessary.

## 2017-08-16 ENCOUNTER — Encounter: Payer: Self-pay | Admitting: Oncology

## 2017-08-16 ENCOUNTER — Ambulatory Visit (HOSPITAL_BASED_OUTPATIENT_CLINIC_OR_DEPARTMENT_OTHER): Payer: Medicaid Other | Admitting: Oncology

## 2017-08-16 ENCOUNTER — Telehealth: Payer: Self-pay | Admitting: Oncology

## 2017-08-16 VITALS — BP 118/75 | HR 108 | Temp 98.5°F | Resp 18 | Ht 64.0 in | Wt 136.8 lb

## 2017-08-16 DIAGNOSIS — C3412 Malignant neoplasm of upper lobe, left bronchus or lung: Secondary | ICD-10-CM

## 2017-08-16 DIAGNOSIS — J189 Pneumonia, unspecified organism: Secondary | ICD-10-CM

## 2017-08-16 DIAGNOSIS — C3492 Malignant neoplasm of unspecified part of left bronchus or lung: Secondary | ICD-10-CM

## 2017-08-16 DIAGNOSIS — J984 Other disorders of lung: Secondary | ICD-10-CM

## 2017-08-16 MED ORDER — OXYCODONE-ACETAMINOPHEN 5-325 MG PO TABS: 1.0000 | ORAL_TABLET | ORAL | 0 refills | Status: DC | PRN

## 2017-08-16 NOTE — Telephone Encounter (Signed)
Patient declined avs and calendar. I did fax referral

## 2017-08-16 NOTE — Progress Notes (Signed)
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  PDL1 expression: 20%.  PRIOR THERAPY: A course of concurrent chemoradiation with weekly carboplatin for AUC of 2 and paclitaxel 45 MG/M2. Status post6cycles.Last dose was given June 27, 2017.  CURRENT THERAPY: None  INTERVAL HISTORY: Raven Cochran 46 y.o. female returns for routine follow-up visit accompanied by her mother.  At her last visit 2 weeks ago, she was treated for postobstructive pneumonitis with a course of Levaquin times 7 days and Medrol Dosepak.  The patient reports that her cough is not really any better.  She still having some chest pain when she coughs.  She uses Percocet 1/2 tablet up to twice a day.  She has been using Delsym for her cough which is not very effective.  She still has no energy following her prior therapy.  She denies fevers and chills.  He denies shortness of breath and hemoptysis.  Denies nausea, vomiting, constipation, diarrhea.  The patient is here for reevaluation.  MEDICAL HISTORY: Past Medical History:  Diagnosis Date  . Anxiety 06/13/2017  . Encounter for antineoplastic chemotherapy 05/12/2017  . Headache    hx migraines none in 10 yrs  . History of radiation therapy 05/19/17-06/29/17   left lung 60 Gy in 30 fractions  . Lung mass    left hilary mass  . PONV (postoperative nausea and vomiting)   . UTI (urinary tract infection) 9 weeks ago    ALLERGIES:  has No Known Allergies.  MEDICATIONS:  Current Outpatient Medications  Medication Sig Dispense Refill  . acetaminophen (TYLENOL) 500 MG tablet Take 500 mg by mouth every 6 (six) hours as needed for moderate pain.    Marland Kitchen ALPRAZolam  (XANAX) 0.5 MG tablet Take 1 tablet (0.5 mg total) 3 (three) times daily as needed by mouth for anxiety. 30 tablet 0  . calcium carbonate (TUMS - DOSED IN MG ELEMENTAL CALCIUM) 500 MG chewable tablet Chew 1 tablet by mouth 3 (three) times daily.    Marland Kitchen esomeprazole (NEXIUM) 20 MG capsule Take 40 mg by mouth daily at 12 noon.    Marland Kitchen levofloxacin (LEVAQUIN) 500 MG tablet Take 1 tablet (500 mg total) by mouth daily. 7 tablet 0  . levonorgestrel (MIRENA) 20 MCG/24HR IUD 1 each by Intrauterine route once. Inserted 8 yrs ago    . lidocaine (XYLOCAINE) 2 % solution Use as directed 15 mLs as needed in the mouth or throat for mouth pain. 100 mL 0  . lidocaine-prilocaine (EMLA) cream Apply 1 application topically as needed. 30 g 0  . magic mouthwash w/lidocaine SOLN Take 10 mLs by mouth 4 (four) times daily as needed for mouth pain. (Patient not taking: Reported on 07/28/2017) 480 mL 1  . methylPREDNISolone (MEDROL) 4 MG tablet Take 6 tabs on day 1, 5 tabs on day 2, 4 tabs on day 3, 3 tabs on day 4, 2 tabs on day 5, 1 tab on day 6, and then stop 21 tablet 0  . naproxen sodium (ANAPROX) 220 MG tablet Take 440 mg by mouth daily as needed (pain).    . ondansetron (ZOFRAN) 8 MG tablet Take 1 tablet (8 mg total) by mouth every 8 (eight) hours as needed for nausea or vomiting. (Patient  not taking: Reported on 07/28/2017) 20 tablet 0  . oxyCODONE-acetaminophen (PERCOCET/ROXICET) 5-325 MG tablet Take 1 tablet by mouth every 4 (four) hours as needed for severe pain. 30 tablet 0  . prochlorperazine (COMPAZINE) 10 MG tablet Take 1 tablet (10 mg total) by mouth every 6 (six) hours as needed for nausea or vomiting. (Patient not taking: Reported on 07/07/2017) 30 tablet 1  . Wound Dressings (SONAFINE EX) Apply topically.     No current facility-administered medications for this visit.     SURGICAL HISTORY:  Past Surgical History:  Procedure Laterality Date  . BREAST SURGERY Bilateral 2000   augmentation  . CESAREAN  SECTION  2006   x 1   . ENDOBRONCHIAL ULTRASOUND Bilateral 05/03/2017   Procedure: ENDOBRONCHIAL ULTRASOUND;  Surgeon: Rigoberto Noel, MD;  Location: [facility];  Service: Cardiopulmonary;  Laterality: Bilateral;  . IR FLUORO GUIDE PORT INSERTION RIGHT  05/19/2017  . IR US GUIDE VASC ACCESS RIGHT  05/19/2017    REVIEW OF SYSTEMS:   Review of Systems  Constitutional: Negative for appetite change, chills, fever and unexpected weight change. Positive for fatigue. HENT:   Negative for mouth sores, nosebleeds, sore throat and trouble swallowing.   Eyes: Negative for eye problems and icterus.  Respiratory: Negative for hemoptysis, shortness of breath.  Positive for cough.  Cardiovascular: Negative leg swelling. Positive for pain in her chest when she coughs. Gastrointestinal: Negative for abdominal pain, constipation, diarrhea, nausea and vomiting.  Genitourinary: Negative for bladder incontinence, difficulty urinating, dysuria, frequency and hematuria.   Musculoskeletal: Negative for back pain, gait problem, neck pain and neck stiffness.  Skin: Negative for itching and rash.  Neurological: Negative for dizziness, extremity weakness, gait problem, headaches, light-headedness and seizures.  Hematological: Negative for adenopathy. Does not bruise/bleed easily.  Psychiatric/Behavioral: Negative for confusion, depression and sleep disturbance. The patient is not nervous/anxious.     PHYSICAL EXAMINATION:  Blood pressure 118/75, pulse (!) 108, temperature 98.5 F (36.9 C), temperature source Oral, resp. rate 18, height 5' 4"  (1.626 m), weight 136 lb 12.8 oz (62.1 kg), SpO2 100 %.  ECOG PERFORMANCE STATUS: 1 - Symptomatic but completely ambulatory  Physical Exam  Constitutional: Oriented to person, place, and time and well-developed, well-nourished, and in no distress. No distress.  HENT:  Head: Normocephalic and atraumatic.  Mouth/Throat: Oropharynx is clear and moist. No oropharyngeal exudate.   Eyes: Conjunctivae are normal. Right eye exhibits no discharge. Left eye exhibits no discharge. No scleral icterus.  Neck: Normal range of motion. Neck supple.  Cardiovascular: Normal rate, regular rhythm, normal heart sounds and intact distal pulses.   Pulmonary/Chest: Effort normal. No respiratory distress. No rales. Expiratory wheezes noted throughout her lung fields. Abdominal: Soft. Bowel sounds are normal. Exhibits no distension and no mass. There is no tenderness.  Musculoskeletal: Normal range of motion. Exhibits no edema.  Lymphadenopathy:    No cervical adenopathy.  Neurological: Alert and oriented to person, place, and time. Exhibits normal muscle tone. Gait normal. Coordination normal.  Skin: Skin is warm and dry. No rash noted. Not diaphoretic. No erythema. No pallor.  Psychiatric: Mood, memory and judgment normal.  Vitals reviewed.  LABORATORY DATA: Lab Results  Component Value Date   WBC 9.3 08/02/2017   HGB 9.7 (L) 08/02/2017   HCT 29.7 (L) 08/02/2017   MCV 92.0 08/02/2017   PLT 303 08/02/2017      Chemistry      Component Value Date/Time   NA 132 (L) 08/02/2017 1111   K  3.9 08/02/2017 1111   CL 97 (L) 05/19/2017 1029   CO2 26 08/02/2017 1111   BUN 3.5 (L) 08/02/2017 1111   CREATININE 0.6 08/02/2017 1111      Component Value Date/Time   CALCIUM 9.1 08/02/2017 1111   ALKPHOS 160 (H) 08/02/2017 1111   AST 14 08/02/2017 1111   ALT 9 08/02/2017 1111   BILITOT 0.61 08/02/2017 1111       RADIOGRAPHIC STUDIES:  Ct Chest W Contrast  Result Date: 07/28/2017 CLINICAL DATA:  Stage IIIA left upper lobe squamous cell lung carcinoma diagnosed September 2018 status post concurrent chemoradiation therapy, completed radiation therapy 06/29/2017. Restaging. EXAM: CT CHEST WITH CONTRAST TECHNIQUE: Multidetector CT imaging of the chest was performed during intravenous contrast administration. CONTRAST:  47m ISOVUE-300 IOPAMIDOL (ISOVUE-300) INJECTION 61% COMPARISON:   04/26/2017 PET-CT.  04/15/2017 chest CT. FINDINGS: Cardiovascular: Normal heart size. Trace pericardial effusion/thickening, slightly increased. Three-vessel coronary atherosclerosis. Right internal jugular MediPort terminates at the cavoatrial junction. Atherosclerotic nonaneurysmal thoracic aorta. Normal caliber pulmonary arteries. No central pulmonary emboli. Mediastinum/Nodes: No discrete thyroid nodules. Mild circumferential wall thickening in the midthoracic esophagus appears new. No axillary adenopathy. Enlarged heterogeneously enhancing 2.9 cm AP window node (series 2/ image 53), increased from 2.3 cm on 04/15/2017 chest CT. Enlarged heterogeneous enhancing 2.9 cm left subcarinal node (series 2/ image 66), increased from 2.0 cm. Infiltrative heterogeneous enhancing bulky left hilar adenopathy measuring up to 3.8 cm (series 2/ image 61), previously 3.6 cm on 04/15/2017 using similar measurement technique, mildly increased. Associated mildly increased extrinsic narrowing of the central left airways and central left pulmonary arteries by the adenopathy. No right hilar adenopathy. Lungs/Pleura: No pneumothorax. New trace dependent left pleural effusion. No right pleural effusion. Left upper lobe 1.8 x 1.0 cm solid pulmonary nodule (series 5/ image 62), previously 2.2 x 1.7 cm, decreased. New subsegmental atelectasis in the lingula. New patchy consolidation and tree-in-bud opacity in the superior segment left lower lobe. Mild compressive atelectasis in the dependent left lower lobe. No new significant pulmonary nodules in the right lung. Mild centrilobular emphysema. Upper abdomen: No acute abnormality. Musculoskeletal: No aggressive appearing focal osseous lesions. Mild thoracic spondylosis. Intact appearing bilateral breast prostheses. IMPRESSION: 1. Left upper lobe 1.8 cm solid pulmonary nodule is decreased. 2. Bulky infiltrative left hilar, AP window and subcarinal nodal metastases are increased, with  associated worsening extrinsic compression of the central left airways and left pulmonary arteries. 3. New patchy consolidation and tree-in-bud opacity in the superior segment left lower lobe, favor postobstructive pneumonitis. 4. New trace dependent left pleural effusion. 5. New mild circumferential wall thickening in the midthoracic esophagus, most compatible with post treatment change. 6. Three-vessel coronary atherosclerosis. Aortic Atherosclerosis (ICD10-I70.0) and Emphysema (ICD10-J43.9). Electronically Signed   By: JIlona SorrelM.D.   On: 07/28/2017 08:43     ASSESSMENT/PLAN:  Stage III squamous cell carcinoma of left lung ([place] This is a very pleasant 46year old white female with a stage IIIa non-small cell lung cancer, squamous cell carcinoma with PDL 1 expression of 20% The patient is currently undergoing a course of concurrent chemoradiation with weekly carboplatin and paclitaxel status post6cycles.  She tolerated the previous course of her treatment well except for odynophagia treated with Carafate and viscous lidocaine as well as pain medications.  The patient had a recent CT scan of the chest which showed improvement in her lung cancer as well as postobstructive pneumonitis.  She was treated with a course of Levaquin 500 mg x 7 days and  a Medrol Dosepak without improvement in her symptoms.  Case reviewed with Dr. Julien Nordmann.  Recommend the patient be seen by pulmonology for evaluation of her postobstructive pneumonitis and cough.  Referral for pulmonology was placed today for her to be seen within 1 week.   We will plan to bring her back in approximately 2-3 weeks for reevaluation and to discuss proceeding with treatment with immunotherapy.  For pain management, she will continue Percocet.  I have refilled this for her today.  The patient was advised to call immediately if she has any concerning symptoms in the interval. The patient voices understanding of current disease status and  treatment options and is in agreement with the current care plan. All questions were answered. The patient knows to call the clinic with any problems, questions or concerns. We can certainly see the patient much sooner if necessary.  Orders Placed This Encounter  Procedures  . Ambulatory referral to Pulmonology    Referral Priority:   Routine    Referral Type:   Consultation    Referral Reason:   Specialty Services Required    Requested Specialty:   Pulmonary Disease    Number of Visits Requested:   T[place]ace [place], DNP, AGPCNP-BC, AOCNP 08/16/17

## 2017-08-16 NOTE — Assessment & Plan Note (Signed)
This is a very pleasant 46 year old white female with a stage IIIa non-small cell lung cancer, squamous cell carcinoma with PDL 1 expression of 20% The patient is currently undergoing a course of concurrent chemoradiation with weekly carboplatin and paclitaxel status post6cycles.  She tolerated the previous course of her treatment well except for odynophagia treated with Carafate and viscous lidocaine as well as pain medications.  The patient had a recent CT scan of the chest which showed improvement in her lung cancer as well as postobstructive pneumonitis.  She was treated with a course of Levaquin 500 mg x 7 days and a Medrol Dosepak without improvement in her symptoms.  Case reviewed with Dr. Julien Nordmann.  Recommend the patient be seen by pulmonology for evaluation of her postobstructive pneumonitis and cough.  Referral for pulmonology was placed today for her to be seen within 1 week.   We will plan to bring her back in approximately 2-3 weeks for reevaluation and to discuss proceeding with treatment with immunotherapy.  For pain management, she will continue Percocet.  I have refilled this for her today.  The patient was advised to call immediately if she has any concerning symptoms in the interval. The patient voices understanding of current disease status and treatment options and is in agreement with the current care plan. All questions were answered. The patient knows to call the clinic with any problems, questions or concerns. We can certainly see the patient much sooner if necessary.

## 2017-08-19 ENCOUNTER — Ambulatory Visit: Payer: Self-pay | Admitting: Adult Health

## 2017-08-25 ENCOUNTER — Other Ambulatory Visit: Payer: Self-pay | Admitting: Oncology

## 2017-08-25 ENCOUNTER — Other Ambulatory Visit (INDEPENDENT_AMBULATORY_CARE_PROVIDER_SITE_OTHER): Payer: Self-pay

## 2017-08-26 ENCOUNTER — Encounter: Payer: Self-pay | Admitting: Radiation Oncology

## 2017-08-26 ENCOUNTER — Encounter: Payer: Self-pay | Admitting: Oncology

## 2017-08-26 ENCOUNTER — Telehealth: Payer: Self-pay | Admitting: *Deleted

## 2017-08-26 ENCOUNTER — Other Ambulatory Visit: Payer: Self-pay | Admitting: Radiation Oncology

## 2017-08-26 DIAGNOSIS — C801 Malignant (primary) neoplasm, unspecified: Secondary | ICD-10-CM

## 2017-08-26 MED ORDER — OXYCODONE-ACETAMINOPHEN 5-325 MG PO TABS: 1.0000 | ORAL_TABLET | ORAL | 0 refills | Status: DC | PRN

## 2017-08-26 NOTE — Progress Notes (Signed)
Pt arrived today in lobby due to running out of pain meds. She states she has been trying to reach med/onc since 08/23/17.  She states continued pain in lungs from cancer.  Received 30 pills of Percocet from [facility] on 08-16-17.  We could not reach med/onc to keep her Rx under the same service, so I refilled the Rx today (disp 30) and recommended she called Cyril Mourning or Dr. Earlie Server on Monday 12-31 for a long term pain management plan/Rx.  Sam Presnell RN is also letting the [place] [facility]) know via VM. -----------------------------------  Eppie Gibson, MD

## 2017-08-26 NOTE — Telephone Encounter (Signed)
Raven Cochran,

## 2017-08-26 NOTE — Telephone Encounter (Signed)
Expected [facility] via back door number at this time.   "This ios Raven Cochran from Radiation Oncology calling to reach Dr. Julien Cochran or Raven Cochran to refill Percocet for this patient.  Presented to Radiation with request to reach Medical Oncology.  Could Triage help as we've been unsuccessful."  Per Raven Cochran, Dr. Lanell Cochran ordered enough for patient to get through [place] weekend. Routing call information to collaborative nurse and provider for evaliatrion/review.  Further patient communication through collaborative nurse.

## 2017-08-29 ENCOUNTER — Other Ambulatory Visit: Payer: Self-pay | Admitting: Radiation Oncology

## 2017-08-29 MED ORDER — ALPRAZOLAM 0.5 MG PO TABS: 0.5000 mg | ORAL_TABLET | Freq: Three times a day (TID) | ORAL | 0 refills | Status: AC | PRN

## 2017-08-31 ENCOUNTER — Telehealth: Payer: Self-pay | Admitting: Oncology

## 2017-08-31 NOTE — Telephone Encounter (Signed)
Called in refill for Xanax to [facility] per Dr. Sondra Come.  Also left message for patient advising her the refill has been called in.

## 2017-09-03 ENCOUNTER — Encounter: Payer: Self-pay | Admitting: Internal Medicine

## 2017-09-06 ENCOUNTER — Telehealth: Payer: Self-pay | Admitting: Internal Medicine

## 2017-09-06 ENCOUNTER — Inpatient Hospital Stay: Payer: BLUE CROSS/BLUE SHIELD | Attending: Internal Medicine | Admitting: Internal Medicine

## 2017-09-06 ENCOUNTER — Encounter: Payer: Self-pay | Admitting: Internal Medicine

## 2017-09-06 VITALS — BP 108/64 | HR 115 | Temp 97.9°F | Resp 18 | Ht 64.0 in | Wt 137.5 lb

## 2017-09-06 DIAGNOSIS — C771 Secondary and unspecified malignant neoplasm of intrathoracic lymph nodes: Secondary | ICD-10-CM | POA: Diagnosis not present

## 2017-09-06 DIAGNOSIS — Z7952 Long term (current) use of systemic steroids: Secondary | ICD-10-CM | POA: Diagnosis not present

## 2017-09-06 DIAGNOSIS — R5383 Other fatigue: Secondary | ICD-10-CM | POA: Diagnosis not present

## 2017-09-06 DIAGNOSIS — Z5112 Encounter for antineoplastic immunotherapy: Secondary | ICD-10-CM

## 2017-09-06 DIAGNOSIS — C801 Malignant (primary) neoplasm, unspecified: Secondary | ICD-10-CM

## 2017-09-06 DIAGNOSIS — Z79899 Other long term (current) drug therapy: Secondary | ICD-10-CM | POA: Diagnosis not present

## 2017-09-06 DIAGNOSIS — Z9221 Personal history of antineoplastic chemotherapy: Secondary | ICD-10-CM | POA: Insufficient documentation

## 2017-09-06 DIAGNOSIS — R05 Cough: Secondary | ICD-10-CM

## 2017-09-06 DIAGNOSIS — C3412 Malignant neoplasm of upper lobe, left bronchus or lung: Secondary | ICD-10-CM | POA: Diagnosis not present

## 2017-09-06 DIAGNOSIS — C3492 Malignant neoplasm of unspecified part of left bronchus or lung: Secondary | ICD-10-CM

## 2017-09-06 DIAGNOSIS — R0609 Other forms of dyspnea: Secondary | ICD-10-CM | POA: Diagnosis not present

## 2017-09-06 DIAGNOSIS — R5382 Chronic fatigue, unspecified: Secondary | ICD-10-CM

## 2017-09-06 DIAGNOSIS — Z7189 Other specified counseling: Secondary | ICD-10-CM

## 2017-09-06 DIAGNOSIS — R079 Chest pain, unspecified: Secondary | ICD-10-CM | POA: Diagnosis not present

## 2017-09-06 MED ORDER — OXYCODONE-ACETAMINOPHEN 5-325 MG PO TABS: 1.0000 | ORAL_TABLET | ORAL | 0 refills | Status: DC | PRN

## 2017-09-06 NOTE — Telephone Encounter (Signed)
Gave avs and calendar for January - march

## 2017-09-06 NOTE — Progress Notes (Signed)
DISCONTINUE ON PATHWAY REGIMEN - Non-Small Cell Lung     Administer weekly:     Paclitaxel      Carboplatin   **Always confirm dose/schedule in your pharmacy ordering system**    REASON: Continuation Of Treatment PRIOR TREATMENT: [id number]: Carboplatin AUC=2 + Paclitaxel 45 mg/m2 Weekly During Radiation TREATMENT RESPONSE: Partial Response (PR)  START ON PATHWAY REGIMEN - Non-Small Cell Lung     A cycle is every 14 days:     Durvalumab   **Always confirm dose/schedule in your pharmacy ordering system**    Patient Characteristics: Stage III - Unresectable, PS = 0, 1 AJCC T Category: T2b Current Disease Status: No Distant Mets or Local Recurrence AJCC N Category: N2 AJCC M Category: M0 AJCC 8 Stage Grouping: IIIA Performance Status: PS = 0, 1 Intent of Therapy: Curative Intent, Discussed with Patient

## 2017-09-06 NOTE — Progress Notes (Signed)
[place] [phone]   [phone]) [phone]  OFFICE PROGRESS NOTE  Sheldon Silvan., MD [address] 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  PDL1 expression: 20%.  PRIOR THERAPY: A course of concurrent chemoradiation with weekly carboplatin for AUC of 2 and paclitaxel 45 MG/M2. Status post 6 cycles.  Last dose was given June 27, 2017.  CURRENT THERAPY: None  INTERVAL HISTORY: Raven Cochran 47 y.o. female returns to the clinic today for follow-up visit.  The patient continues to complain of shortness of breath with exertion and mild cough.  She was treated with a course of antibiotics and steroids with mild improvement of her symptoms.  She denied having any current chest pain or hemoptysis.  She denied having any recent weight loss or night sweats.  She has no nausea, vomiting, diarrhea or constipation.  She continues to have mild pain on the right side of the chest and she is requesting refill of her pain medication.  She is here today for evaluation and discussion of her treatment options.  MEDICAL HISTORY: Past Medical History:  Diagnosis Date  . Anxiety 06/13/2017  . Encounter for antineoplastic chemotherapy 05/12/2017  . Headache    hx migraines none in 10 yrs  . History of radiation therapy 05/19/17-06/29/17   left lung 60 Gy in 30 fractions  . Lung mass    left hilary mass  . PONV (postoperative nausea and vomiting)   . UTI (urinary tract infection) 9 weeks ago    ALLERGIES:  has No Known Allergies.  MEDICATIONS:  Current Outpatient Medications  Medication Sig Dispense Refill  . acetaminophen (TYLENOL) 500 MG tablet Take 500 mg by mouth every 6 (six) hours as needed for moderate pain.    Marland Kitchen ALPRAZolam  (XANAX) 0.5 MG tablet Take 1 tablet (0.5 mg total) by mouth 3 (three) times daily as needed for anxiety. 30 tablet 0  . calcium carbonate (TUMS - DOSED IN MG ELEMENTAL CALCIUM) 500 MG chewable tablet Chew 1 tablet by mouth 3 (three) times daily.    Marland Kitchen esomeprazole (NEXIUM) 20 MG capsule Take 40 mg by mouth daily at 12 noon.    Marland Kitchen estradiol (ESTRACE) 1 MG tablet Take by mouth.    . levofloxacin (LEVAQUIN) 500 MG tablet Take 1 tablet (500 mg total) by mouth daily. 7 tablet 0  . levonorgestrel (MIRENA) 20 MCG/24HR IUD 1 each by Intrauterine route once. Inserted 8 yrs ago    . lidocaine (XYLOCAINE) 2 % solution Use as directed 15 mLs as needed in the mouth or throat for mouth pain. 100 mL 0  . lidocaine-prilocaine (EMLA) cream Apply 1 application topically as needed. 30 g 0  . magic mouthwash w/lidocaine SOLN Take 10 mLs by mouth 4 (four) times daily as needed for mouth pain. 480 mL 1  . methylPREDNISolone (MEDROL) 4 MG tablet Take 6 tabs on day 1, 5 tabs on day 2, 4 tabs on day 3, 3 tabs on day 4, 2 tabs on day 5, 1 tab on day 6, and then stop 21 tablet 0  . naproxen sodium (ANAPROX) 220 MG tablet Take 440 mg by mouth daily as needed (pain).    . ondansetron (ZOFRAN) 8 MG tablet Take 1 tablet (8 mg total) by mouth every  8 (eight) hours as needed for nausea or vomiting. 20 tablet 0  . oxyCODONE-acetaminophen (PERCOCET/ROXICET) 5-325 MG tablet Take 1 tablet by mouth every 4 (four) hours as needed for severe pain. 30 tablet 0  . prochlorperazine (COMPAZINE) 10 MG tablet Take 1 tablet (10 mg total) by mouth every 6 (six) hours as needed for nausea or vomiting. 30 tablet 1  . Wound Dressings (SONAFINE EX) Apply topically.     No current facility-administered medications for this visit.     SURGICAL HISTORY:  Past Surgical History:  Procedure Laterality Date  . BREAST SURGERY Bilateral 2000   augmentation  . CESAREAN SECTION  2006   x 1   . ENDOBRONCHIAL ULTRASOUND Bilateral 05/03/2017   Procedure:  ENDOBRONCHIAL ULTRASOUND;  Surgeon: Rigoberto Noel, MD;  Location: [facility];  Service: Cardiopulmonary;  Laterality: Bilateral;  . IR FLUORO GUIDE PORT INSERTION RIGHT  05/19/2017  . IR US GUIDE VASC ACCESS RIGHT  05/19/2017    REVIEW OF SYSTEMS:  Constitutional: positive for fatigue Eyes: negative Ears, nose, mouth, throat, and face: negative Respiratory: positive for cough and dyspnea on exertion Cardiovascular: negative Gastrointestinal: negative Genitourinary:negative Integument/breast: negative Hematologic/lymphatic: negative Musculoskeletal:negative Neurological: negative Behavioral/Psych: negative Endocrine: negative Allergic/Immunologic: negative   PHYSICAL EXAMINATION: General appearance: alert, cooperative, fatigued and no distress Head: Normocephalic, without obvious abnormality, atraumatic Neck: no adenopathy, no JVD, supple, symmetrical, trachea midline and thyroid not enlarged, symmetric, no tenderness/mass/nodules Lymph nodes: Cervical, supraclavicular, and axillary nodes normal. Resp: clear to auscultation bilaterally Back: symmetric, no curvature. ROM normal. No CVA tenderness. Cardio: regular rate and rhythm, S1, S2 normal, no murmur, click, rub or gallop GI: soft, non-tender; bowel sounds normal; no masses,  no organomegaly Extremities: extremities normal, atraumatic, no cyanosis or edema Neurologic: Alert and oriented X 3, normal strength and tone. Normal symmetric reflexes. Normal coordination and gait  ECOG PERFORMANCE STATUS: 1 - Symptomatic but completely ambulatory  Blood pressure 108/64, pulse (!) 115, temperature 97.9 F (36.6 C), temperature source Oral, resp. rate 18, height 5' 4" (1.626 m), weight 137 lb 8 oz (62.4 kg), SpO2 94 %.  LABORATORY DATA: Lab Results  Component Value Date   WBC 9.3 08/02/2017   HGB 9.7 (L) 08/02/2017   HCT 29.7 (L) 08/02/2017   MCV 92.0 08/02/2017   PLT 303 08/02/2017      Chemistry      Component Value  Date/Time   NA 132 (L) 08/02/2017 1111   K 3.9 08/02/2017 1111   CL 97 (L) 05/19/2017 1029   CO2 26 08/02/2017 1111   BUN 3.5 (L) 08/02/2017 1111   CREATININE 0.6 08/02/2017 1111      Component Value Date/Time   CALCIUM 9.1 08/02/2017 1111   ALKPHOS 160 (H) 08/02/2017 1111   AST 14 08/02/2017 1111   ALT 9 08/02/2017 1111   BILITOT 0.61 08/02/2017 1111       RADIOGRAPHIC STUDIES: No results found.  ASSESSMENT AND PLAN: this is a very pleasant 47 years old white female with a stage IIIa non-small cell lung cancer, squamous cell carcinoma with PDL 1 expression of 20% The patient is currently undergoing a course of concurrent chemoradiation with weekly carboplatin and paclitaxel status post 6 cycles.  She tolerated the previous course of her treatment well except for odynophagia treated with Carafate and viscous lidocaine as well as pain medications. Her scan showed partial response to this treatment. I had a lengthy discussion with the patient today about her condition and treatment options.  I gave  her the option of continuous observation and close monitoring versus proceeding with consolidation immunotherapy was Imfinzi (Durvalumab) 10 mg/KG every 2 weeks for a total of 1 year unless the patient has concerning toxicities or disease progression. She is interested in proceeding with the consolidation immunotherapy and she is expected to start the first dose of this treatment next week. I discussed with her the adverse effect of this treatment including but not limited to immunotherapy mediated skin rash, diarrhea, inflammation of the lung, kidney, liver, thyroid or other endocrine dysfunction. For COPD, she is scheduled to see a pulmonologist next week for evaluation of her condition.  We will advised against high dose of steroids for management of her condition unless absolutely necessary. The patient will come back for follow-up visit in 3 weeks for reevaluation before starting cycle #2  of her treatment. She was advised to call immediately if she has any concerning symptoms in the interval. The patient voices understanding of current disease status and treatment options and is in agreement with the current care plan. All questions were answered. The patient knows to call the clinic with any problems, questions or concerns. We can certainly see the patient much sooner if necessary.  Disclaimer: This note was dictated with voice recognition software. Similar sounding words can inadvertently be transcribed and may not be corrected upon review.

## 2017-09-08 ENCOUNTER — Ambulatory Visit (HOSPITAL_BASED_OUTPATIENT_CLINIC_OR_DEPARTMENT_OTHER)
Admission: RE | Admit: 2017-09-08 | Discharge: 2017-09-08 | Disposition: A | Discharge status: Home / Self Care | Payer: BLUE CROSS/BLUE SHIELD | Source: Ambulatory Visit | Attending: Adult Health | Admitting: Adult Health

## 2017-09-08 ENCOUNTER — Ambulatory Visit (INDEPENDENT_AMBULATORY_CARE_PROVIDER_SITE_OTHER): Payer: BLUE CROSS/BLUE SHIELD | Admitting: Adult Health

## 2017-09-08 ENCOUNTER — Encounter: Payer: Self-pay | Admitting: Adult Health

## 2017-09-08 VITALS — BP 106/70 | HR 105 | Ht 64.0 in | Wt 132.0 lb

## 2017-09-08 DIAGNOSIS — J449 Chronic obstructive pulmonary disease, unspecified: Secondary | ICD-10-CM | POA: Diagnosis not present

## 2017-09-08 DIAGNOSIS — C3490 Malignant neoplasm of unspecified part of unspecified bronchus or lung: Secondary | ICD-10-CM

## 2017-09-08 DIAGNOSIS — J984 Other disorders of lung: Secondary | ICD-10-CM

## 2017-09-08 DIAGNOSIS — J479 Bronchiectasis, uncomplicated: Secondary | ICD-10-CM

## 2017-09-08 DIAGNOSIS — J189 Pneumonia, unspecified organism: Secondary | ICD-10-CM | POA: Diagnosis not present

## 2017-09-08 DIAGNOSIS — R590 Localized enlarged lymph nodes: Secondary | ICD-10-CM | POA: Diagnosis not present

## 2017-09-08 DIAGNOSIS — C3492 Malignant neoplasm of unspecified part of left bronchus or lung: Secondary | ICD-10-CM | POA: Diagnosis not present

## 2017-09-08 MED ORDER — ALBUTEROL SULFATE (2.5 MG/3ML) 0.083% IN NEBU: 2.5000 mg | INHALATION_SOLUTION | Freq: Four times a day (QID) | RESPIRATORY_TRACT | 12 refills | Status: AC | PRN

## 2017-09-08 MED ORDER — UMECLIDINIUM-VILANTEROL 62.5-25 MCG/INH IN AEPB: 1.0000 | INHALATION_SPRAY | Freq: Every day | RESPIRATORY_TRACT | 11 refills | Status: AC

## 2017-09-08 MED ORDER — UMECLIDINIUM-VILANTEROL 62.5-25 MCG/INH IN AEPB: 1.0000 | INHALATION_SPRAY | Freq: Every day | RESPIRATORY_TRACT | 0 refills | Status: AC

## 2017-09-08 MED ORDER — AMOXICILLIN-POT CLAVULANATE 875-125 MG PO TABS: 1.0000 | ORAL_TABLET | Freq: Two times a day (BID) | ORAL | 0 refills | Status: AC

## 2017-09-08 MED ORDER — LEVALBUTEROL HCL 0.63 MG/3ML IN NEBU
0.6300 mg | INHALATION_SOLUTION | Freq: Four times a day (QID) | RESPIRATORY_TRACT | Status: AC
  Administered 2017-09-08: 0.63 mg via RESPIRATORY_TRACT

## 2017-09-08 NOTE — Assessment & Plan Note (Signed)
Lung Cancer w/ Left sided post obstructive s/p chemo/radiation  Will give course of antibiotics to see if any improvemtn  Add mucolytics/BD /smoking cessation   Plan  . Patient Instructions  Augmentin 875mg  Twice daily  For 1 week  . -take with food.  Mucinex DM Twice daily  As needed  Cough/congestion  Begin ANORO 1 puff daily . Rinse after use.  Albuterol neb every 6hr as needed wheezing /shortness of breath  Follow up with Dr. Elsworth Soho  In 2 weeks and As needed   Please contact office for sooner follow up if symptoms do not improve or worsen or seek emergency care

## 2017-09-08 NOTE — Addendum Note (Signed)
Addended by: Georjean Mode on: 09/08/2017 02:43 PM   Modules accepted: Orders

## 2017-09-08 NOTE — Assessment & Plan Note (Signed)
Cont follow up with ONcology  To begin Immunotherapy in near future .

## 2017-09-08 NOTE — Patient Instructions (Addendum)
Augmentin 875mg  Twice daily  For 1 week  . -take with food.  Mucinex DM Twice daily  As needed  Cough/congestion  Begin ANORO 1 puff daily . Rinse after use.  Albuterol neb every 6hr as needed wheezing /shortness of breath  Follow up with Dr. Elsworth Soho  In 2 weeks and As needed   Please contact office for sooner follow up if symptoms do not improve or worsen or seek emergency care

## 2017-09-08 NOTE — Progress Notes (Signed)
@Patient  ID: Raven Cochran, female    DOB: 02/19/1971, 47 y.o.   MRN: [id number]  Chief Complaint  Patient presents with  . Follow-up    Cough    Referring provider: Sheldon Silvan., MD  HPI: 47 yo female smoker seen for pulmonary consult on 04/15/17 for lung mass found to have stage IIIa non-small cell lung cancer favoring squamous cell carcinoma Undergoing treatment with concurrent chemoradiation.  Finished radiation and carboplatin and paclitaxel . Last dose 06/27/17 .   TEST  CT chest 04/15/17 >LEFT upper lobe nodule concerning for bronchogenic carcinoma.Metastatic adenopathy to the LEFT hilum, subcarinal nodal station and prevascular nodal stations. 04/26/17 PET scan Hypermetabolic LEFT upper lobe pulmonary nodule consistent primary bronchogenic carcinoma. 2. Bulky hypermetabolic LEFT hilar prevascular and subcarinal / peribronchial metastatic adenopathy. 3. No contralateral or supraclavicular metastatic lymph nodes. MRI September 2000 18-  CT chest July 28, 2017> decreased left upper lobe nodule to 1.8 cm (previously 2.2 cm).  Bulky infiltrative left hilar and subcarinal nodal metastasis are increase with associated worsening of extrinsic compression of the central left airway and pulmonary arteries, patchy consolidation in the superior segment of left lower lobe consistent with a post obstructive pneumonitis.  09/08/2017 Follow up : Cough/Dyspnea/Lung cancer  Pt presents for an follow up visit .  Patient has been undergoing lung cancer treatment.  She was diagnosed with a stage IIIa non-small cell lung cancer with a left upper lobe nodule with metastatic adenopathy.  She has completed a course of chemo and radiation.  Last chemo was in June 27, 2017.  Follow-up CT chest July 28, 2017 showed a decreased left upper lobe nodule at 1.8 cm.  Previously measuring at 2.2 cm.  There was increased left hilar and subcarinal nodal metastasis with associated worsening of  extrinsic compression of the left central airway.  There was also  patchy consolidation in the left lower lobe consistent with a postobstructive pneumonitis.  Patient says that she continues to have persistent cough intermittent congestion and shortness of breath.  Feels that her breathing is not doing well she denies any fever.  She remains weak.  Has a low appetite.. She denies any fever hemoptysis chest pain orthopnea PND or increased leg swelling there is no calf pain.  Weight is down 35 pounds at 132 Chest x-ray today shows mediastinal and left hilar adenopathy.  Increased opacity in the left lung base consistent with postobstructive pneumonia.  She continues to smoke.  Smoking cessation was discussed. Spirometry today shows severe airflow obstruction with an FEV1 at 44% ratio 67, FVC 52%.  She has albuterol nebulizer at home but does not have any medications currently.    No Known Allergies  Immunization History  Administered Date(s) Administered  . Influenza,inj,Quad PF,6+ Mos 06/06/2017    Past Medical History:  Diagnosis Date  . Anxiety 06/13/2017  . Encounter for antineoplastic chemotherapy 05/12/2017  . Headache    hx migraines none in 10 yrs  . History of radiation therapy 05/19/17-06/29/17   left lung 60 Gy in 30 fractions  . Lung mass    left hilary mass  . PONV (postoperative nausea and vomiting)   . UTI (urinary tract infection) 9 weeks ago    Tobacco History: Social History   Tobacco Use  Smoking Status Current Every Day Smoker  . Packs/day: 0.25  . Years: 22.00  . Pack years: 5.50  . Types: Cigarettes  Smokeless Tobacco Never Used   Ready to quit: No Counseling given: Yes  Outpatient Encounter Medications as of 09/08/2017  Medication Sig  . acetaminophen (TYLENOL) 500 MG tablet Take 500 mg by mouth every 6 (six) hours as needed for moderate pain.  Marland Kitchen ALPRAZolam (XANAX) 0.5 MG tablet Take 1 tablet (0.5 mg total) by mouth 3 (three) times daily as needed for  anxiety.  Marland Kitchen oxyCODONE-acetaminophen (PERCOCET/ROXICET) 5-325 MG tablet Take 1 tablet by mouth every 4 (four) hours as needed for severe pain.  Marland Kitchen amoxicillin-clavulanate (AUGMENTIN) 875-125 MG tablet Take 1 tablet by mouth 2 (two) times daily for 7 days.  . calcium carbonate (TUMS - DOSED IN MG ELEMENTAL CALCIUM) 500 MG chewable tablet Chew 1 tablet by mouth 3 (three) times daily.  Marland Kitchen esomeprazole (NEXIUM) 20 MG capsule Take 40 mg by mouth daily at 12 noon.  Marland Kitchen estradiol (ESTRACE) 1 MG tablet Take by mouth.  . levofloxacin (LEVAQUIN) 500 MG tablet Take 1 tablet (500 mg total) by mouth daily. (Patient not taking: Reported on 09/08/2017)  . levonorgestrel (MIRENA) 20 MCG/24HR IUD 1 each by Intrauterine route once. Inserted 8 yrs ago  . lidocaine (XYLOCAINE) 2 % solution Use as directed 15 mLs as needed in the mouth or throat for mouth pain. (Patient not taking: Reported on 09/08/2017)  . lidocaine-prilocaine (EMLA) cream Apply 1 application topically as needed. (Patient not taking: Reported on 09/08/2017)  . magic mouthwash w/lidocaine SOLN Take 10 mLs by mouth 4 (four) times daily as needed for mouth pain. (Patient not taking: Reported on 09/08/2017)  . methylPREDNISolone (MEDROL) 4 MG tablet Take 6 tabs on day 1, 5 tabs on day 2, 4 tabs on day 3, 3 tabs on day 4, 2 tabs on day 5, 1 tab on day 6, and then stop (Patient not taking: Reported on 09/08/2017)  . naproxen sodium (ANAPROX) 220 MG tablet Take 440 mg by mouth daily as needed (pain).  . ondansetron (ZOFRAN) 8 MG tablet Take 1 tablet (8 mg total) by mouth every 8 (eight) hours as needed for nausea or vomiting. (Patient not taking: Reported on 09/08/2017)  . prochlorperazine (COMPAZINE) 10 MG tablet Take 1 tablet (10 mg total) by mouth every 6 (six) hours as needed for nausea or vomiting. (Patient not taking: Reported on 09/08/2017)  . Wound Dressings (SONAFINE EX) Apply topically.   No facility-administered encounter medications on file as of 09/08/2017.       Review of Systems  Constitutional:   No  weight loss, night sweats,  Fevers, chills, fatigue, or  lassitude.  HEENT:   No headaches,  Difficulty swallowing,  Tooth/dental problems, or  Sore throat,                No sneezing, itching, ear ache,  +nasal congestion, post nasal drip,   CV:  No chest pain,  Orthopnea, PND, swelling in lower extremities, anasarca, dizziness, palpitations, syncope.   GI  No heartburn, indigestion, abdominal pain, nausea, vomiting, diarrhea, change in bowel habits, loss of appetite, bloody stools.   Resp:   No chest wall deformity  Skin: no rash or lesions.  GU: no dysuria, change in color of urine, no urgency or frequency.  No flank pain, no hematuria   MS:  No joint pain or swelling.  No decreased range of motion.  No back pain.    Physical Exam  BP 106/70 (BP Location: Left Arm, Cuff Size: Normal)   Pulse (!) 105   Ht 5\' 4"  (1.626 m)   Wt 132 lb (59.9 kg)   SpO2 99%   BMI  22.66 kg/m   GEN: A/Ox3; pleasant , NAD    HEENT:  [place]/AT,  EACs-clear, TMs-wnl, NOSE-clear, THROAT-clear, no lesions, no postnasal drip or exudate noted.   NECK:  Supple w/ fair ROM; no JVD; normal carotid impulses w/o bruits; no thyromegaly or nodules palpated; no lymphadenopathy.    RESP  Few trace rhonchi  no accessory muscle use, no dullness to percussion  CARD:  RRR, no m/r/g, no peripheral edema, pulses intact, no cyanosis or clubbing. Neg calf pain , neg homans sign   GI:   Soft & nt; nml bowel sounds; no organomegaly or masses detected.   Musco: Warm bil, no deformities or joint swelling noted.   Neuro: alert, no focal deficits noted.    Skin: Warm, no lesions or rashes    Lab Results:  CBC    Component Value Date/Time   WBC 9.3 08/02/2017 1111   WBC 10.1 05/19/2017 1029   RBC 3.23 (L) 08/02/2017 1111   RBC 4.22 05/19/2017 1029   HGB 9.7 (L) 08/02/2017 1111   HCT 29.7 (L) 08/02/2017 1111   PLT 303 08/02/2017 1111   MCV 92.0 08/02/2017 1111    MCH 30.0 08/02/2017 1111   MCH 30.3 05/19/2017 1029   MCHC 32.7 08/02/2017 1111   MCHC 34.7 05/19/2017 1029   RDW 18.9 (H) 08/02/2017 1111   LYMPHSABS 0.6 (L) 08/02/2017 1111   MONOABS 0.7 08/02/2017 1111   EOSABS 0.1 08/02/2017 1111   BASOSABS 0.0 08/02/2017 1111    BMET    Component Value Date/Time   NA 132 (L) 08/02/2017 1111   K 3.9 08/02/2017 1111   CL 97 (L) 05/19/2017 1029   CO2 26 08/02/2017 1111   GLUCOSE 98 08/02/2017 1111   BUN 3.5 (L) 08/02/2017 1111   CREATININE 0.6 08/02/2017 1111   CALCIUM 9.1 08/02/2017 1111   GFRNONAA >60 05/19/2017 1029   GFRAA >60 05/19/2017 1029    BNP No results found for: BNP  ProBNP No results found for: PROBNP  Imaging: Dg Chest 2 View  Result Date: 09/08/2017 CLINICAL DATA:  Cough and congestion for a month,smoker,hx of lung cancer EXAM: CHEST  2 VIEW COMPARISON:  CT 07/27/2017 and chest x-ray 05/03/2017 FINDINGS: Right-sided Port-A-Cath tip overlies the level of superior vena cava. The heart size is normal. There is persistent mediastinal and left hilar adenopathy. There is increased opacity in the left lung base, associated with volume loss. The right lung is clear. No pulmonary edema. IMPRESSION: Mediastinal and left hilar adenopathy. Increased opacity in the left lung base consistent with postobstructive pneumonia or atelectasis. Electronically Signed   By: Nolon Nations M.D.   On: 09/08/2017 10:40     Assessment & Plan:   COPD (chronic obstructive pulmonary disease) (HCC) Severe COPD w/ ongoing smoking , underlying lung cancer  Smoking cessation discussed  Begin LABA/LAMA   Plan  Begin ANORO daily  follow up in 2 weeks and As needed    Stage III squamous cell carcinoma of left lung ([place]) Cont follow up with ONcology  To begin Immunotherapy in near future .    Pneumonitis Lung Cancer w/ Left sided post obstructive s/p chemo/radiation  Will give course of antibiotics to see if any improvemtn  Add  mucolytics/BD /smoking cessation   Plan  . Patient Instructions  Augmentin 875mg  Twice daily  For 1 week  . -take with food.  Mucinex DM Twice daily  As needed  Cough/congestion  Begin ANORO 1 puff daily . Rinse after use.  Albuterol neb every 6hr as needed wheezing /shortness of breath  Follow up with Dr. Elsworth Soho  In 2 weeks and As needed   Please contact office for sooner follow up if symptoms do not improve or worsen or seek emergency care           Rexene Edison, NP 09/08/2017

## 2017-09-08 NOTE — Assessment & Plan Note (Signed)
Severe COPD w/ ongoing smoking , underlying lung cancer  Smoking cessation discussed  Begin LABA/LAMA   Plan  Begin ANORO daily  follow up in 2 weeks and As needed

## 2017-09-08 NOTE — Addendum Note (Signed)
Addended by: Georjean Mode on: 09/08/2017 02:12 PM   Modules accepted: Orders

## 2017-09-13 ENCOUNTER — Other Ambulatory Visit: Payer: Self-pay | Admitting: Medical Oncology

## 2017-09-13 DIAGNOSIS — C3492 Malignant neoplasm of unspecified part of left bronchus or lung: Secondary | ICD-10-CM

## 2017-09-14 ENCOUNTER — Inpatient Hospital Stay: Payer: BLUE CROSS/BLUE SHIELD

## 2017-09-14 VITALS — BP 115/75 | HR 111 | Temp 98.4°F | Resp 16 | Wt 131.2 lb

## 2017-09-14 DIAGNOSIS — C3492 Malignant neoplasm of unspecified part of left bronchus or lung: Secondary | ICD-10-CM

## 2017-09-14 DIAGNOSIS — C3412 Malignant neoplasm of upper lobe, left bronchus or lung: Secondary | ICD-10-CM | POA: Diagnosis not present

## 2017-09-14 LAB — CBC WITH DIFFERENTIAL (CANCER CENTER ONLY)
Basophils Absolute: 0 10*3/uL (ref 0.0–0.1)
Basophils Relative: 0 %
EOS PCT: 0 %
Eosinophils Absolute: 0.1 10*3/uL (ref 0.0–0.5)
HCT: 25 % — ABNORMAL LOW (ref 34.8–46.6)
Hemoglobin: 7.9 g/dL — ABNORMAL LOW (ref 11.6–15.9)
LYMPHS ABS: 0.7 10*3/uL — AB (ref 0.9–3.3)
LYMPHS PCT: 5 %
MCH: 27.5 pg (ref 25.1–34.0)
MCHC: 31.6 g/dL (ref 31.5–36.0)
MCV: 87.1 fL (ref 79.5–101.0)
MONOS PCT: 5 %
Monocytes Absolute: 0.7 10*3/uL (ref 0.1–0.9)
Neutro Abs: 12 10*3/uL — ABNORMAL HIGH (ref 1.5–6.5)
Neutrophils Relative %: 90 %
Platelet Count: 340 10*3/uL (ref 145–400)
RBC: 2.87 MIL/uL — AB (ref 3.70–5.45)
RDW: 18.3 % — ABNORMAL HIGH (ref 11.2–16.1)
WBC: 13.4 10*3/uL — AB (ref 3.9–10.3)

## 2017-09-14 LAB — CMP (CANCER CENTER ONLY)
ALT: 41 U/L (ref 0–55)
AST: 34 U/L (ref 5–34)
Albumin: 2.5 g/dL — ABNORMAL LOW (ref 3.5–5.0)
Alkaline Phosphatase: 210 U/L — ABNORMAL HIGH (ref 40–150)
Anion gap: 12 — ABNORMAL HIGH (ref 3–11)
BUN: 9 mg/dL (ref 7–26)
CHLORIDE: 94 mmol/L — AB (ref 98–109)
CO2: 26 mmol/L (ref 22–29)
CREATININE: 0.61 mg/dL (ref 0.60–1.10)
Calcium: 9.6 mg/dL (ref 8.4–10.4)
GFR, Est AFR Am: >60 mL/min (ref >60)
Glucose, Bld: 140 mg/dL (ref 70–140)
POTASSIUM: 3.7 mmol/L (ref 3.3–4.7)
SODIUM: 132 mmol/L — AB (ref 136–145)
Total Bilirubin: 0.5 mg/dL (ref 0.2–1.2)
Total Protein: 7.1 g/dL (ref 6.4–8.3)

## 2017-09-14 MED ORDER — HEPARIN SOD (PORK) LOCK FLUSH 100 UNIT/ML IV SOLN
500.0000 [IU] | Freq: Once | INTRAVENOUS | Status: AC | PRN
  Administered 2017-09-14: 500 [IU]
  Filled 2017-09-14: qty 5

## 2017-09-14 MED ORDER — SODIUM CHLORIDE 0.9% FLUSH
10.0000 mL | INTRAVENOUS | Status: DC | PRN
  Administered 2017-09-14: 10 mL
  Filled 2017-09-14: qty 10

## 2017-09-14 MED ORDER — SODIUM CHLORIDE 0.9 % IV SOLN
Freq: Once | INTRAVENOUS | Status: AC
  Administered 2017-09-14: 15:00:00 via INTRAVENOUS

## 2017-09-14 MED ORDER — SODIUM CHLORIDE 0.9 % IV SOLN
10.0000 mg/kg | Freq: Once | INTRAVENOUS | Status: AC
  Administered 2017-09-14: 620 mg via INTRAVENOUS
  Filled 2017-09-14: qty 2.4

## 2017-09-14 NOTE — Progress Notes (Signed)
Per Dr. Julien Nordmann, ok to treat with Imfinizi with hemoglobin of 7.9.

## 2017-09-14 NOTE — Patient Instructions (Signed)
[facility] Discharge Instructions for Patients Receiving Chemotherapy  Today you received the following chemotherapy agents Imfinzi.  To help prevent nausea and vomiting after your treatment, we encourage you to take your nausea medication as directed.  If you develop nausea and vomiting that is not controlled by your nausea medication, call the clinic.   BELOW ARE SYMPTOMS THAT SHOULD BE REPORTED IMMEDIATELY:  *FEVER GREATER THAN 100.5 F  *CHILLS WITH OR WITHOUT FEVER  NAUSEA AND VOMITING THAT IS NOT CONTROLLED WITH YOUR NAUSEA MEDICATION  *UNUSUAL SHORTNESS OF BREATH  *UNUSUAL BRUISING OR BLEEDING  TENDERNESS IN MOUTH AND THROAT WITH OR WITHOUT PRESENCE OF ULCERS  *URINARY PROBLEMS  *BOWEL PROBLEMS  UNUSUAL RASH Items with * indicate a potential emergency and should be followed up as soon as possible.  Feel free to call the clinic should you have any questions or concerns. The clinic phone number is [phone].  Please show the [facility] at check-in to the Emergency Department and triage nurse.  Durvalumab injection What is this medicine? DURVALUMAB (dur VAL ue mab) is a monoclonal antibody. It is used to treat urothelial cancer. This medicine may be used for other purposes; ask your health care provider or pharmacist if you have questions. COMMON BRAND NAME(S): IMFINZI What should I tell my health care provider before I take this medicine? They need to know if you have any of these conditions: -diabetes -immune system problems -infection -inflammatory bowel disease -kidney disease -liver disease -lung or breathing disease -lupus -organ transplant -stomach or intestine problems -thyroid disease -an unusual or allergic reaction to durvalumab, other medicines, foods, dyes, or preservatives -pregnant or trying to get pregnant -breast-feeding How should I use this medicine? This medicine is for infusion into a vein. It is given by a  health care professional in a hospital or clinic setting. A special MedGuide will be given to you before each treatment. Be sure to read this information carefully each time. Talk to your pediatrician regarding the use of this medicine in children. Special care may be needed. Overdosage: If you think you have taken too much of this medicine contact a poison control center or emergency room at once. NOTE: This medicine is only for you. Do not share this medicine with others. What if I miss a dose? It is important not to miss your dose. Call your doctor or health care professional if you are unable to keep an appointment. What may interact with this medicine? Interactions have not been studied. This list may not describe all possible interactions. Give your health care provider a list of all the medicines, herbs, non-prescription drugs, or dietary supplements you use. Also tell them if you smoke, drink alcohol, or use illegal drugs. Some items may interact with your medicine. What should I watch for while using this medicine? This drug may make you feel generally unwell. Continue your course of treatment even though you feel ill unless your doctor tells you to stop. You may need blood work done while you are taking this medicine. Do not become pregnant while taking this medicine or for 3 months after stopping it. Women should inform their doctor if they wish to become pregnant or think they might be pregnant. There is a potential for serious side effects to an unborn child. Talk to your health care professional or pharmacist for more information. Do not breast-feed an infant while taking this medicine or for 3 months after stopping it. What side effects may I  notice from receiving this medicine? Side effects that you should report to your doctor or health care professional as soon as possible: -allergic reactions like skin rash, itching or hives, swelling of the face, lips, or tongue -black, tarry  stools -bloody or watery diarrhea -breathing problems -change in emotions or moods -change in sex drive -changes in vision -chest pain or chest tightness -chills -confusion -cough -facial flushing -fever -headache -signs and symptoms of high blood sugar such as dizziness; dry mouth; dry skin; fruity breath; nausea; stomach pain; increased hunger or thirst; increased urination -signs and symptoms of liver injury like dark yellow or brown urine; general ill feeling or flu-like symptoms; light-colored stools; loss of appetite; nausea; right upper belly pain; unusually weak or tired; yellowing of the eyes or skin -stomach pain -trouble passing urine or change in the amount of urine -weight gain or weight loss Side effects that usually do not require medical attention (report these to your doctor or health care professional if they continue or are bothersome): -bone pain -constipation -loss of appetite -muscle pain -nausea -swelling of the ankles, feet, hands -tiredness This list may not describe all possible side effects. Call your doctor for medical advice about side effects. You may report side effects to FDA at 1-800-FDA-1088. Where should I keep my medicine? This drug is given in a hospital or clinic and will not be stored at home. NOTE: This sheet is a summary. It may not cover all possible information. If you have questions about this medicine, talk to your doctor, pharmacist, or health care provider.  2018 [employer] Standard (2016-03-19 15:50:36)

## 2017-09-16 ENCOUNTER — Other Ambulatory Visit: Payer: Self-pay | Admitting: Medical Oncology

## 2017-09-16 ENCOUNTER — Telehealth: Payer: Self-pay | Admitting: Medical Oncology

## 2017-09-16 DIAGNOSIS — C801 Malignant (primary) neoplasm, unspecified: Secondary | ICD-10-CM

## 2017-09-16 MED ORDER — OXYCODONE-ACETAMINOPHEN 5-325 MG PO TABS: 1.0000 | ORAL_TABLET | ORAL | 0 refills | Status: AC | PRN

## 2017-09-16 NOTE — Telephone Encounter (Signed)
I left message for pt to return call and tell me about her pain .

## 2017-09-26 ENCOUNTER — Ambulatory Visit: Payer: BLUE CROSS/BLUE SHIELD | Admitting: Pulmonary Disease

## 2017-09-27 ENCOUNTER — Encounter: Payer: Self-pay | Admitting: Internal Medicine

## 2017-09-27 ENCOUNTER — Ambulatory Visit: Payer: BLUE CROSS/BLUE SHIELD | Admitting: Pulmonary Disease

## 2017-09-28 ENCOUNTER — Telehealth: Payer: Self-pay

## 2017-09-28 ENCOUNTER — Other Ambulatory Visit: Payer: BLUE CROSS/BLUE SHIELD

## 2017-09-28 ENCOUNTER — Ambulatory Visit: Payer: BLUE CROSS/BLUE SHIELD | Admitting: Oncology

## 2017-09-28 ENCOUNTER — Ambulatory Visit: Payer: BLUE CROSS/BLUE SHIELD

## 2017-09-28 ENCOUNTER — Telehealth: Payer: Self-pay | Admitting: Medical Oncology

## 2017-09-28 NOTE — Telephone Encounter (Signed)
I left message for pt -she had labs/kristen  today at 0830. I told her to call back so we can get her in for appt.

## 2017-09-28 NOTE — Telephone Encounter (Signed)
Spoke to Raven Cochran who said pt had pulmonary appt on Tuesday 2017-10-02 at 1100 . That morning he said Raven Cochran told him she could not go so he cancelled appt. He said Raven Cochran sat down and rested. He checked on her that evening and called EMS per pt request. Within 5 minutes before EMS arrive he said he looked at her and her head was turned. He said  EMS did CPR for 15 minutes but pt died.

## 2017-09-28 NOTE — Telephone Encounter (Signed)
A phone call was made and there was no answer from patient.  A message was left asking the patient to call us back and let us know how she is feeling.  I also told the patient to please come into our symptom management clinic if she still isn't feeling well.

## 2017-09-28 NOTE — Telephone Encounter (Signed)
Patients boyfriend, Janine Ores, called and left a message informing us that Reshunda died last night.

## 2017-09-29 ENCOUNTER — Telehealth: Payer: Self-pay | Admitting: Medical Oncology

## 2017-09-29 NOTE — Telephone Encounter (Addendum)
Re-pt death . Not a suspicious death, but  rather complication from her lung cancer.

## 2017-09-29 NOTE — Telephone Encounter (Signed)
Requesting Raven Cochran sign death certificate. I told them Raven Cochran defers to Medical Examiner.

## 2017-09-30 DEATH — deceased

## 2017-10-12 ENCOUNTER — Ambulatory Visit: Payer: BLUE CROSS/BLUE SHIELD

## 2017-10-12 ENCOUNTER — Ambulatory Visit: Payer: BLUE CROSS/BLUE SHIELD | Admitting: Internal Medicine

## 2017-10-12 ENCOUNTER — Other Ambulatory Visit: Payer: BLUE CROSS/BLUE SHIELD

## 2017-10-26 ENCOUNTER — Ambulatory Visit: Payer: BLUE CROSS/BLUE SHIELD | Admitting: Internal Medicine

## 2017-10-26 ENCOUNTER — Ambulatory Visit: Payer: BLUE CROSS/BLUE SHIELD

## 2017-10-26 ENCOUNTER — Other Ambulatory Visit: Payer: BLUE CROSS/BLUE SHIELD

## 2017-11-09 ENCOUNTER — Other Ambulatory Visit: Payer: BLUE CROSS/BLUE SHIELD

## 2017-11-09 ENCOUNTER — Ambulatory Visit: Payer: BLUE CROSS/BLUE SHIELD | Admitting: Internal Medicine

## 2017-11-09 ENCOUNTER — Ambulatory Visit: Payer: BLUE CROSS/BLUE SHIELD

## 2017-11-10 ENCOUNTER — Other Ambulatory Visit: Payer: Self-pay | Admitting: Nurse Practitioner

## 2017-11-23 ENCOUNTER — Other Ambulatory Visit: Payer: BLUE CROSS/BLUE SHIELD

## 2017-11-23 ENCOUNTER — Ambulatory Visit: Payer: BLUE CROSS/BLUE SHIELD | Admitting: Internal Medicine

## 2017-11-30 ENCOUNTER — Ambulatory Visit: Payer: BLUE CROSS/BLUE SHIELD

## 2019-07-10 IMAGING — CT CT CHEST W/ CM
2 of 3 series · 15 of 36 positions shown, 18 images · IV contrast (iopamidol)
Comparison: None.

CLINICAL DATA: Persistent cough

EXAM:
CT CHEST WITH CONTRAST
TECHNIQUE: Multidetector CT imaging of the chest was performed during
intravenous contrast administration.
CONTRAST:  75mL [id number] IOPAMIDOL ([id number]) INJECTION 61%

[Series 2: axial st · axial · 0.75mm/px · z∈[-118,+132]mm · 12 of 147 slices shown, 15 images]
[im 11/147  mediastinal]
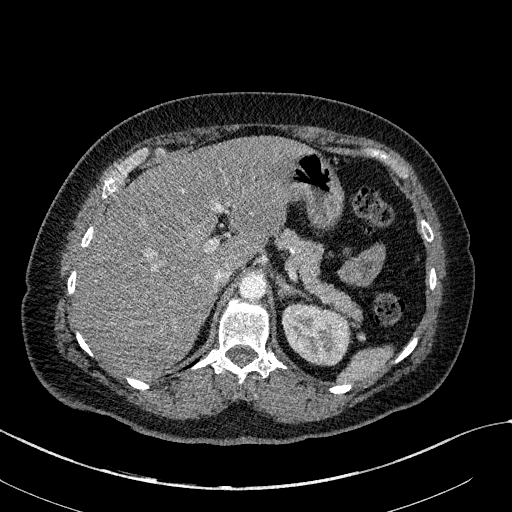
[im 11/147  lung]
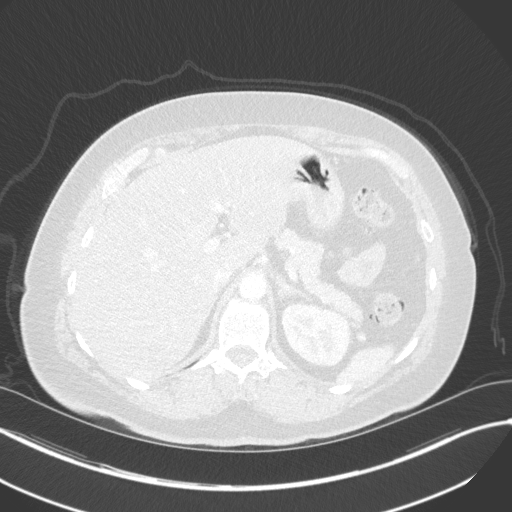
[im 22/147  lung]
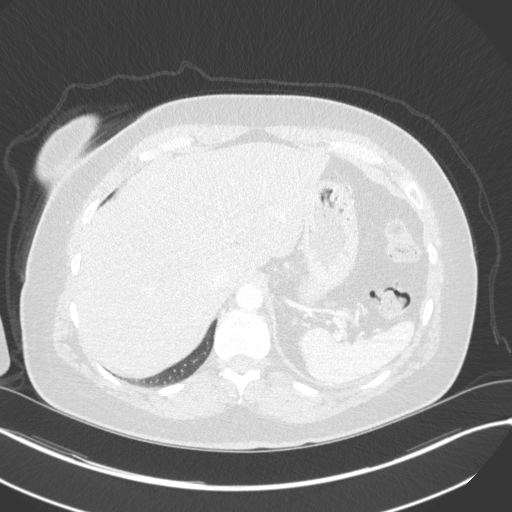
[im 33/147  lung]
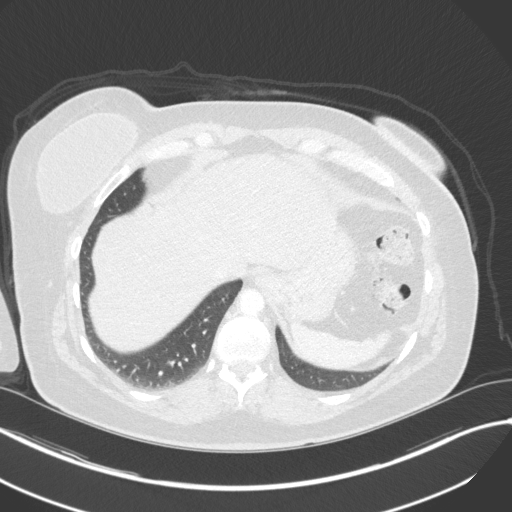
[im 44/147  lung]
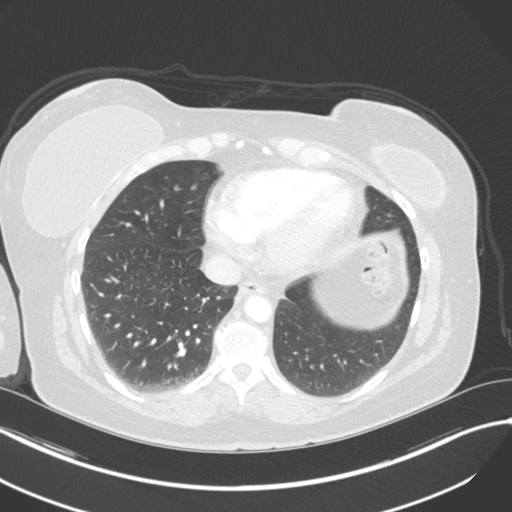
[im 55/147  mediastinal]
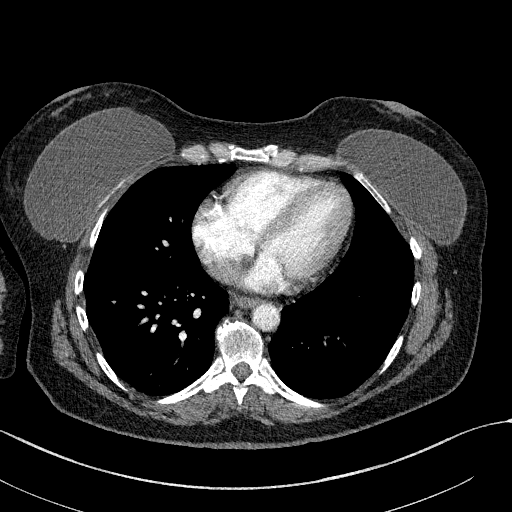
[im 55/147  lung]
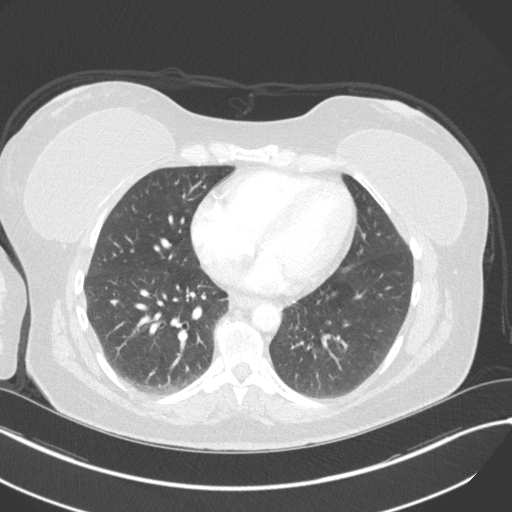
[im 65/147  lung]
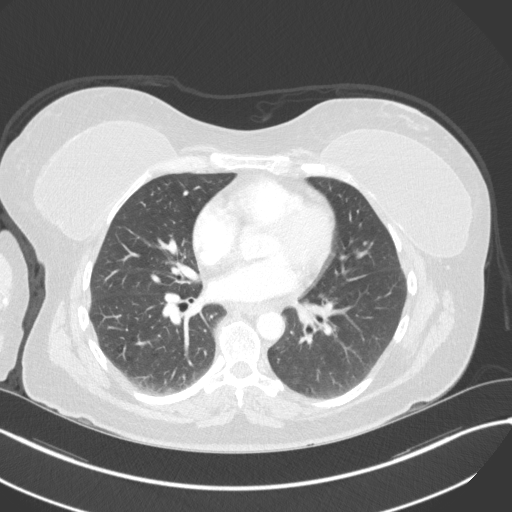
[im 82/147  lung]
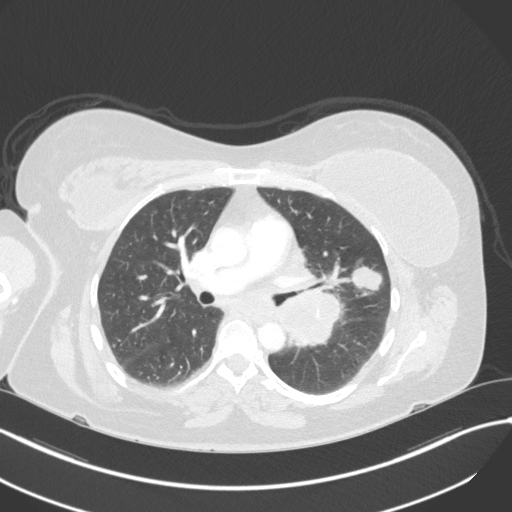
[im 92/147  lung]
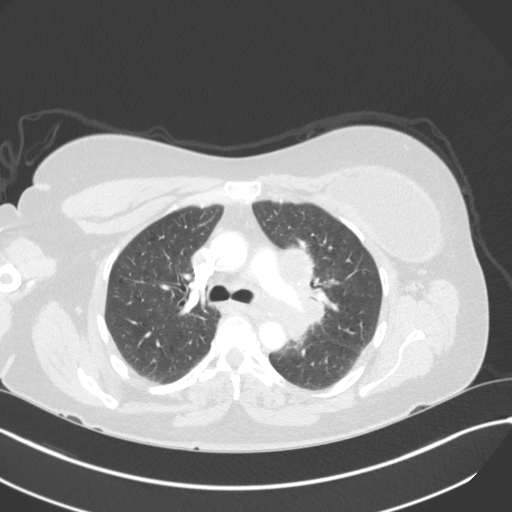
[im 103/147  mediastinal]
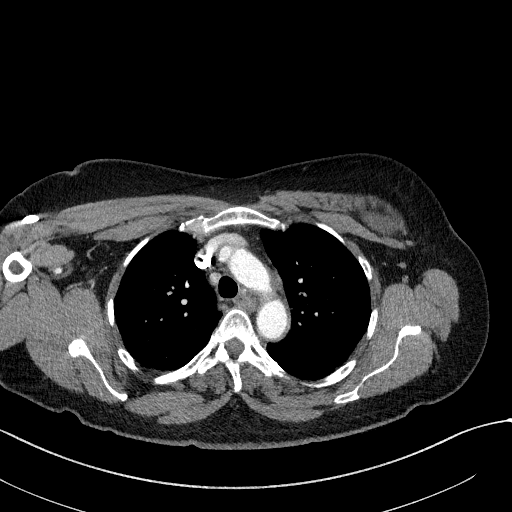
[im 103/147  lung]
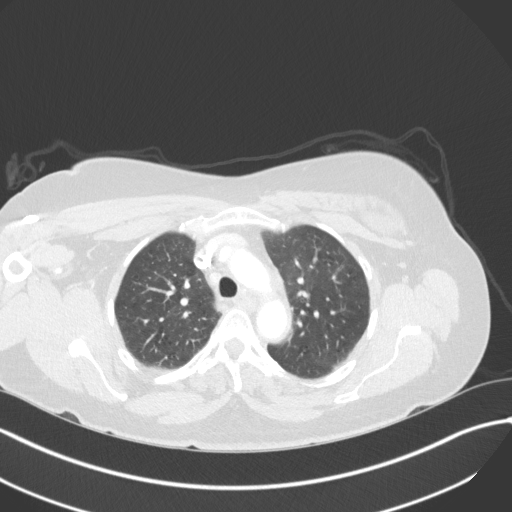
[im 114/147  lung]
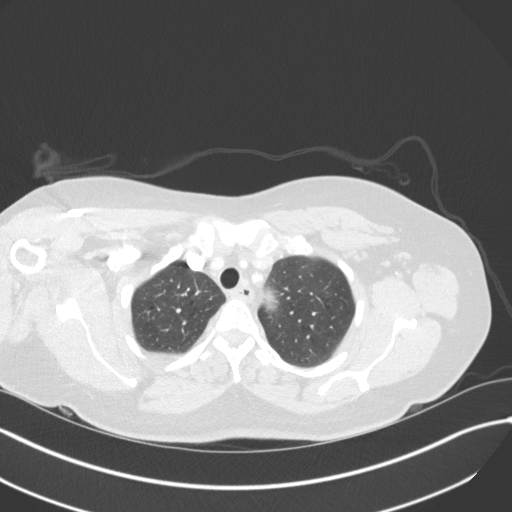
[im 125/147  lung]
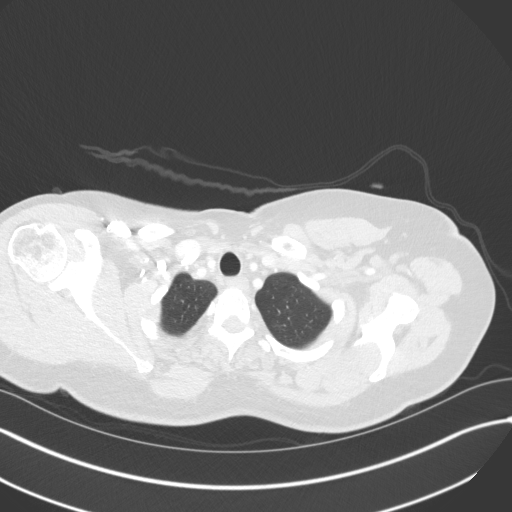
[im 136/147  lung]
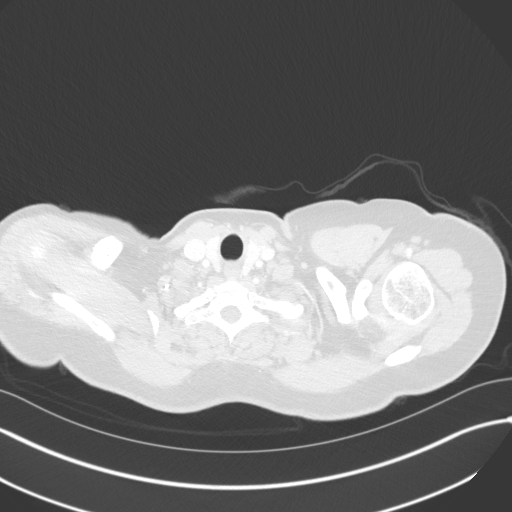

[Series 5: coronal · coronal · 0.59mm/px · 3 of 121 slices shown]
[im 25/121  lung]
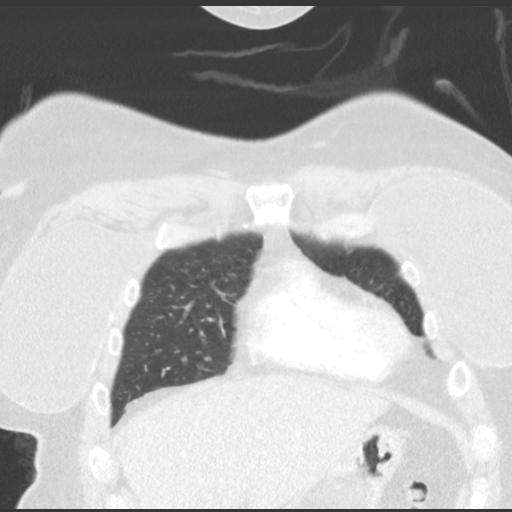
[im 49/121  lung]
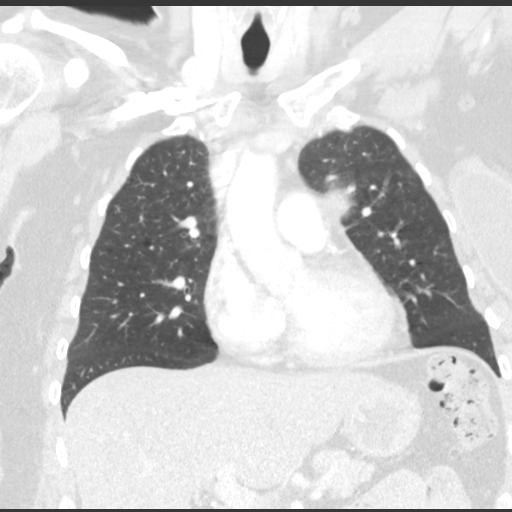
[im 73/121  lung]
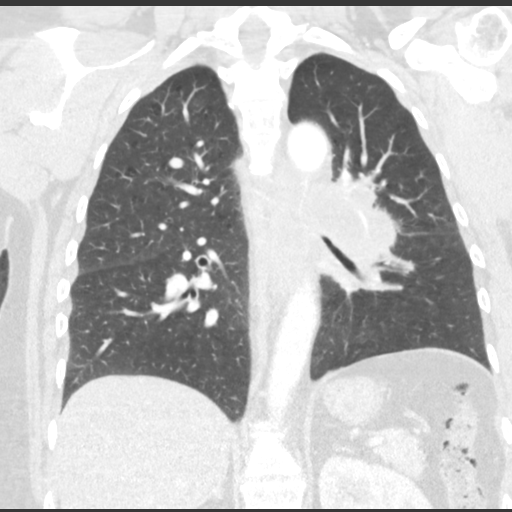

[15 of 36 positions shown; findings below may reference images not displayed]

FINDINGS: Cardiovascular: LEFT main pulmonary artery is compressed by LEFT
hilar mass. No evidence of pulmonary embolism. Coronary
calcifications noted. No pericardial

Mediastinum/Nodes: No axillary supraclavicular adenopathy.

Bulky LEFT hilar and Prevascular lymphadenopathy. Subcarinal
adenopathy additionally. Example LEFT infrahilar nodal mass measures
4 cm. Prevascular node measures 2.3 cm. Subcarinal node measures 2
cm.

Lungs/Pleura: Within the LEFT upper lobe oblong nodule measures
x 1.7 cm (image 67, series 3).

Upper Abdomen: Adrenal glands are partially imaged. No hepatic
lesion on partially imaged liver.

Musculoskeletal: No aggressive osseous lesion. Bilateral
subglandular breast implants.
IMPRESSION: 1. LEFT upper lobe nodule concerning for bronchogenic carcinoma.
2. Metastatic adenopathy to the LEFT hilum, subcarinal nodal station
and prevascular nodal stations.
3. No clear contralateral nodal metastasis or supraclavicular nodal
metastasis.
4. If multidisciplinary follow up management is desired, this is
available in the [REDACTED] through the [REDACTED] [phone]. Consider FDG PET scan for staging and
biopsy planning.
These results will be called to the ordering clinician or
representative by the Radiologist Assistant, and communication
documented in the PACS or zVision Dashboard.
